# Patient Record
Sex: Male | Born: 1965 | Race: White | Hispanic: No | Marital: Single | Smoking: Current every day smoker
Health system: Southern US, Community
[De-identification: ages and names within clinical notes are randomized; demographics above are authoritative.]

## PROBLEM LIST (undated history)

## (undated) DIAGNOSIS — K635 Polyp of colon: Secondary | ICD-10-CM

## (undated) DIAGNOSIS — K2100 Gastro-esophageal reflux disease with esophagitis, without bleeding: Secondary | ICD-10-CM

## (undated) DIAGNOSIS — N529 Male erectile dysfunction, unspecified: Secondary | ICD-10-CM

## (undated) DIAGNOSIS — K648 Other hemorrhoids: Secondary | ICD-10-CM

## (undated) DIAGNOSIS — K5790 Diverticulosis of intestine, part unspecified, without perforation or abscess without bleeding: Secondary | ICD-10-CM

## (undated) HISTORY — PX: EXPLORATORY LAPAROTOMY: SUR591

## (undated) HISTORY — DX: Other hemorrhoids: K64.8

## (undated) HISTORY — PX: WRIST FUSION: SHX839

## (undated) HISTORY — PX: OTHER SURGICAL HISTORY: SHX169

## (undated) HISTORY — DX: Male erectile dysfunction, unspecified: N52.9

## (undated) HISTORY — DX: Diverticulosis of intestine, part unspecified, without perforation or abscess without bleeding: K57.90

## (undated) HISTORY — PX: KNEE SURGERY: SHX244

## (undated) HISTORY — DX: Gastro-esophageal reflux disease with esophagitis: K21.0

## (undated) HISTORY — DX: Gastro-esophageal reflux disease with esophagitis, without bleeding: K21.00

## (undated) HISTORY — PX: COLONOSCOPY W/ BIOPSIES: SHX1374

## (undated) HISTORY — DX: Polyp of colon: K63.5

---

## 1999-10-31 ENCOUNTER — Emergency Department (HOSPITAL_COMMUNITY): Admission: EM | Admit: 1999-10-31 | Discharge: 1999-10-31 | Payer: Self-pay | Admitting: Emergency Medicine

## 1999-11-01 ENCOUNTER — Encounter: Admission: RE | Admit: 1999-11-01 | Discharge: 1999-11-01 | Payer: Self-pay | Admitting: Internal Medicine

## 1999-11-08 ENCOUNTER — Encounter: Admission: RE | Admit: 1999-11-08 | Discharge: 1999-11-08 | Payer: Self-pay | Admitting: Internal Medicine

## 2005-05-05 ENCOUNTER — Emergency Department (HOSPITAL_COMMUNITY): Admission: EM | Admit: 2005-05-05 | Discharge: 2005-05-05 | Payer: Self-pay | Admitting: Emergency Medicine

## 2010-03-17 ENCOUNTER — Emergency Department (HOSPITAL_COMMUNITY)
Admission: EM | Admit: 2010-03-17 | Discharge: 2010-03-17 | Payer: Self-pay | Source: Home / Self Care | Admitting: Emergency Medicine

## 2012-06-20 DIAGNOSIS — K635 Polyp of colon: Secondary | ICD-10-CM

## 2012-06-20 HISTORY — DX: Polyp of colon: K63.5

## 2013-04-23 LAB — LIPID PANEL
CHOLESTEROL: 148 (ref 0–200)
HDL: 29 — AB (ref 35–70)
LDL CALC: 76
TRIGLYCERIDES: 216 — AB (ref 40–160)

## 2013-04-23 LAB — HEPATIC FUNCTION PANEL
ALK PHOS: 64 (ref 25–125)
ALT: 19 (ref 10–40)
AST: 12 — AB (ref 14–40)
BILIRUBIN, TOTAL: 0.2

## 2013-04-23 LAB — BASIC METABOLIC PANEL
BUN: 15 (ref 4–21)
Creatinine: 0.9 (ref 0.6–1.3)
Glucose: 112
Potassium: 4.1 (ref 3.4–5.3)
SODIUM: 142 (ref 137–147)

## 2013-07-28 LAB — HEMOGLOBIN A1C: Hemoglobin A1C: 5.5

## 2013-07-28 LAB — VITAMIN B12: VITAMIN B 12: 482

## 2015-01-20 LAB — LIPID PANEL
Cholesterol: 166 (ref 0–200)
HDL: 56 (ref 35–70)
LDL CALC: 87
Triglycerides: 117 (ref 40–160)

## 2015-01-20 LAB — BASIC METABOLIC PANEL
BUN: 18 (ref 4–21)
Creatinine: 1.1 (ref 0.6–1.3)
GLUCOSE: 120
Potassium: 4.2 (ref 3.4–5.3)
SODIUM: 143 (ref 137–147)

## 2015-01-20 LAB — HEPATIC FUNCTION PANEL
ALK PHOS: 81 (ref 25–125)
ALT: 31 (ref 10–40)
AST: 18 (ref 14–40)
BILIRUBIN, TOTAL: 0.2

## 2015-01-20 LAB — CBC AND DIFFERENTIAL
HEMATOCRIT: 41 (ref 41–53)
Hemoglobin: 13.5 (ref 13.5–17.5)
PLATELETS: 224 (ref 150–399)
WBC: 6.5

## 2015-01-20 LAB — VITAMIN D 25 HYDROXY (VIT D DEFICIENCY, FRACTURES): VIT D 25 HYDROXY: 41.1

## 2015-03-22 DIAGNOSIS — M7711 Lateral epicondylitis, right elbow: Secondary | ICD-10-CM | POA: Diagnosis not present

## 2015-05-19 DIAGNOSIS — M7711 Lateral epicondylitis, right elbow: Secondary | ICD-10-CM | POA: Diagnosis not present

## 2015-07-20 DIAGNOSIS — M7711 Lateral epicondylitis, right elbow: Secondary | ICD-10-CM | POA: Diagnosis not present

## 2015-10-20 DIAGNOSIS — M7711 Lateral epicondylitis, right elbow: Secondary | ICD-10-CM | POA: Diagnosis not present

## 2015-11-13 DIAGNOSIS — Z23 Encounter for immunization: Secondary | ICD-10-CM | POA: Diagnosis not present

## 2016-01-24 DIAGNOSIS — M7711 Lateral epicondylitis, right elbow: Secondary | ICD-10-CM | POA: Diagnosis not present

## 2016-04-19 DIAGNOSIS — K2210 Ulcer of esophagus without bleeding: Secondary | ICD-10-CM | POA: Diagnosis not present

## 2016-04-19 DIAGNOSIS — Z1389 Encounter for screening for other disorder: Secondary | ICD-10-CM | POA: Diagnosis not present

## 2016-04-19 DIAGNOSIS — R102 Pelvic and perineal pain: Secondary | ICD-10-CM | POA: Diagnosis not present

## 2016-04-19 DIAGNOSIS — E785 Hyperlipidemia, unspecified: Secondary | ICD-10-CM | POA: Diagnosis not present

## 2016-04-19 DIAGNOSIS — Z79899 Other long term (current) drug therapy: Secondary | ICD-10-CM | POA: Diagnosis not present

## 2016-04-19 DIAGNOSIS — Z0000 Encounter for general adult medical examination without abnormal findings: Secondary | ICD-10-CM | POA: Diagnosis not present

## 2016-04-19 DIAGNOSIS — Z125 Encounter for screening for malignant neoplasm of prostate: Secondary | ICD-10-CM | POA: Diagnosis not present

## 2016-04-19 LAB — LIPID PANEL
CHOLESTEROL: 162 (ref 0–200)
HDL: 55 (ref 35–70)
LDL Cholesterol: 76
Triglycerides: 157 (ref 40–160)

## 2016-04-19 LAB — HEPATIC FUNCTION PANEL
ALK PHOS: 95 (ref 25–125)
ALT: 23 (ref 10–40)
AST: 15 (ref 14–40)
BILIRUBIN, TOTAL: 0.1

## 2016-04-19 LAB — CBC AND DIFFERENTIAL
HCT: 41 (ref 41–53)
Hemoglobin: 13.7 (ref 13.5–17.5)
PLATELETS: 256 (ref 150–399)
WBC: 7.7

## 2016-04-19 LAB — BASIC METABOLIC PANEL
BUN: 25 — AB (ref 4–21)
CREATININE: 1 (ref 0.6–1.3)
Glucose: 100
POTASSIUM: 4 (ref 3.4–5.3)
Sodium: 141 (ref 137–147)

## 2016-04-19 LAB — PSA: PSA: 0.56

## 2016-04-24 DIAGNOSIS — M1711 Unilateral primary osteoarthritis, right knee: Secondary | ICD-10-CM | POA: Diagnosis not present

## 2016-04-24 DIAGNOSIS — M7711 Lateral epicondylitis, right elbow: Secondary | ICD-10-CM | POA: Diagnosis not present

## 2016-07-24 DIAGNOSIS — M25521 Pain in right elbow: Secondary | ICD-10-CM | POA: Diagnosis not present

## 2016-07-24 DIAGNOSIS — M7711 Lateral epicondylitis, right elbow: Secondary | ICD-10-CM | POA: Diagnosis not present

## 2016-08-20 DIAGNOSIS — Q799 Congenital malformation of musculoskeletal system, unspecified: Secondary | ICD-10-CM | POA: Diagnosis not present

## 2016-08-20 DIAGNOSIS — M24574 Contracture, right foot: Secondary | ICD-10-CM | POA: Diagnosis not present

## 2016-08-20 DIAGNOSIS — M24575 Contracture, left foot: Secondary | ICD-10-CM | POA: Diagnosis not present

## 2016-08-20 DIAGNOSIS — M19071 Primary osteoarthritis, right ankle and foot: Secondary | ICD-10-CM | POA: Diagnosis not present

## 2016-08-20 DIAGNOSIS — M779 Enthesopathy, unspecified: Secondary | ICD-10-CM | POA: Diagnosis not present

## 2016-08-20 DIAGNOSIS — M19072 Primary osteoarthritis, left ankle and foot: Secondary | ICD-10-CM | POA: Diagnosis not present

## 2016-10-05 DIAGNOSIS — T63441A Toxic effect of venom of bees, accidental (unintentional), initial encounter: Secondary | ICD-10-CM | POA: Diagnosis not present

## 2016-11-13 DIAGNOSIS — M7711 Lateral epicondylitis, right elbow: Secondary | ICD-10-CM | POA: Diagnosis not present

## 2016-11-13 DIAGNOSIS — M25521 Pain in right elbow: Secondary | ICD-10-CM | POA: Diagnosis not present

## 2016-12-23 DIAGNOSIS — Z23 Encounter for immunization: Secondary | ICD-10-CM | POA: Diagnosis not present

## 2017-01-30 ENCOUNTER — Ambulatory Visit (INDEPENDENT_AMBULATORY_CARE_PROVIDER_SITE_OTHER): Payer: Medicare Other | Admitting: Family Medicine

## 2017-01-30 ENCOUNTER — Encounter: Payer: Self-pay | Admitting: Family Medicine

## 2017-01-30 VITALS — BP 118/78 | HR 98 | Temp 98.6°F | Ht 72.0 in | Wt 225.0 lb

## 2017-01-30 DIAGNOSIS — Z833 Family history of diabetes mellitus: Secondary | ICD-10-CM | POA: Diagnosis not present

## 2017-01-30 DIAGNOSIS — J441 Chronic obstructive pulmonary disease with (acute) exacerbation: Secondary | ICD-10-CM | POA: Diagnosis not present

## 2017-01-30 DIAGNOSIS — N529 Male erectile dysfunction, unspecified: Secondary | ICD-10-CM | POA: Diagnosis not present

## 2017-01-30 DIAGNOSIS — K219 Gastro-esophageal reflux disease without esophagitis: Secondary | ICD-10-CM

## 2017-01-30 DIAGNOSIS — Z736 Limitation of activities due to disability: Secondary | ICD-10-CM | POA: Diagnosis not present

## 2017-01-30 DIAGNOSIS — Z789 Other specified health status: Secondary | ICD-10-CM | POA: Diagnosis not present

## 2017-01-30 DIAGNOSIS — F17200 Nicotine dependence, unspecified, uncomplicated: Secondary | ICD-10-CM

## 2017-01-30 DIAGNOSIS — Z723 Lack of physical exercise: Secondary | ICD-10-CM | POA: Diagnosis not present

## 2017-01-30 MED ORDER — FLUTICASONE PROPIONATE 50 MCG/ACT NA SUSP
NASAL | 6 refills | Status: DC
Start: 2017-01-30 — End: 2017-01-30

## 2017-01-30 MED ORDER — FLUTICASONE PROPIONATE 50 MCG/ACT NA SUSP: NASAL | 6 refills | Status: DC

## 2017-01-30 MED ORDER — HYDROCOD POLST-CPM POLST ER 10-8 MG/5ML PO SUER: 5.0000 mL | Freq: Two times a day (BID) | ORAL | 0 refills | Status: DC | PRN

## 2017-01-30 MED ORDER — FLUTICASONE-SALMETEROL 250-50 MCG/DOSE IN AEPB: 1.0000 | INHALATION_SPRAY | Freq: Two times a day (BID) | RESPIRATORY_TRACT | 3 refills | Status: DC

## 2017-01-30 NOTE — Progress Notes (Signed)
New patient office visit note:  Impression and Recommendations:    1. COPD with acute exacerbation ([place])   2. Gastroesophageal reflux disease, esophagitis presence not specified   3. Current every day smoker-about 20-25-pack-year history.   4. Social drinker   5. Family history of diabetes mellitus in father- age 51   6. Erectile dysfunction, unspecified erectile dysfunction type   7. on disability but is very active   8. Inactivity    Told pt:  Please make sure you sign papers so we can get the paperwork from your old doctor and your labs etc.   1. Gastroesophageal reflux disease, esophagitis presence not specified -Continue omeprazole as needed  2. Current every day smoker-about 20-25-pack-year history. -Asiyah made to give patient smoking cessation materials.   - Patient currently in pre-contemplative phases of change  3. Social drinker -Not an issue for himself or other family members does not think it is an issue for patient  4. Family history of diabetes mellitus in father- age 1 -  Since you had a physical in January with blood work you can make an appointment with me for a physical this coming January and at that time we can get the fasting blood work; we will screen for diabetes  5. Erectile dysfunction, unspecified erectile dysfunction type -Explained to patient it is important that he quit smoking.  Discussed damage to vasculature which can cause ED in older men  6. COPD with acute exacerbation ([facility]) - Fluticasone-Salmeterol (ADVAIR DISKUS) 250-50 MCG/DOSE AEPB; Inhale 1 puff 2 (two) times daily into the lungs. - fluticasone (FLONASE) 50 MCG/ACT nasal spray; 1 spray each nostril twice daily after sinus rinse using a YR or Milta Deiters med or the like - If you are not feeling better over the next couple of weeks or if you take a turn for the worse and developed fever /chills and increase in your symptoms call me as we might need to start Augmentin or a Z-Pak on you  7.  on disability but is very active -Patient does heavy yard work, Insurance claims handler.   - Does not appear to have physical limitations  8. Inactivity -Encouraged 150 minutes weekly of cardiovascular moderate intensity activity.  Other orders - omeprazole (PRILOSEC) 20 MG capsule; Take 20 mg daily by mouth. - sildenafil (VIAGRA) 50 MG tablet; Take 50 mg daily as needed by mouth for erectile dysfunction. - chlorpheniramine-HYDROcodone ([place]) 10-8 MG/5ML SUER; Take 5 mLs every 12 (twelve) hours as needed by mouth for cough (cough, will cause drowsiness.).  Meds ordered this encounter  . chlorpheniramine-HYDROcodone (TUSSIONEX) 10-8 MG/5ML SUER    Sig: Take 5 mLs every 12 (twelve) hours as needed by mouth for cough (cough, will cause drowsiness.).    Dispense:  120 mL    Refill:  0  . Fluticasone-Salmeterol (ADVAIR DISKUS) 250-50 MCG/DOSE AEPB    Sig: Inhale 1 puff 2 (two) times daily into the lungs.    Dispense:  1 each    Refill:  3  . fluticasone (FLONASE) 50 MCG/ACT nasal spray    Sig: 1 spray each nostril twice daily after sinus rinse using a YR or Milta Deiters med or the like    Dispense:  16 g    Refill:  6     Education and routine counseling performed. Handouts provided.   Gross side effects, risk and benefits, and alternatives of medications discussed with patient.  Patient is aware that all medications have potential side effects and we  are unable to predict every side effect or drug-drug interaction that may occur.  Expresses verbal understanding and consents to current therapy plan and treatment regimen.  Return for couple months- For fasting labs and CPE.  Please see AVS handed out to patient at the end of our visit for further patient instructions/ counseling done pertaining to today's office visit.    Note: This document was prepared using Dragon voice recognition software and may include unintentional dictation  errors.  ----------------------------------------------------------------------------------------------------------------------    Subjective:    Chief complaint:   Chief Complaint  Patient presents with  . Establish Care     HPI: Charles Martin is a pleasant 51 y.o. male who presents to [place] [facility] today to review their medical history with me and establish care.   I asked the patient to review their chronic problem list with me to ensure everything was updated and accurate.    All recent office visits with other providers, any medical records that patient brought in etc  - I reviewed today.     Also asked pt to get me medical records from [facility] providers/ specialists that they had seen within the past 3-5 years- if they are in private practice and/or do not work for a [employer], [facility], [facility], [place] or [employer] owned practice.  Told them to call their specialists to clarify this if they are not sure.    Did go to the doctor yrly prior.  Used to weigh 275lbs couple yrs ago.   Moved from [place] recently-  BF of [name], [name].   Disabled from bad accident- broke bunch of bones back in 1989- fell through roof. 12 sx.   Chronic R elbow pain   Patient is currently dating.   One partner- [name]- together since June 2018.    He has 2 children and 7 grandchildren.     Exercises-  Very little.  But works manual labor often- active lifestyle but no set exercise routine.   20-25 yrs- smoked cig.  About 1ppd, no interest in quitting.   Sick for 3-4 wks. has had a productive cough then.  Worse at night.  Coughs up greenish yellow stuff. No F/C objectively. Wheezing - w at night.     Wt Readings from Last 3 Encounters:  01/30/17 225 lb (102.1 kg)   BP Readings from Last 3 Encounters:  01/30/17 118/78   Pulse Readings from Last 3 Encounters:  01/30/17 98   BMI Readings from Last 3 Encounters:  01/30/17 30.52 kg/m    Patient Care Team     Relationship Specialty Notifications Start End  [name], DO PCP - General Family Medicine  01/30/17     Patient Active Problem List   Diagnosis Date Noted  . Current every day smoker-about 20-25-pack-year history. 01/30/2017    Priority: High  . GERD (gastroesophageal reflux disease) 01/30/2017    Priority: Medium  . Family history of diabetes mellitus in father- age 71 01/30/2017    Priority: Low  . ED (erectile dysfunction) 01/30/2017    Priority: Low  . on disability but is very active 02/12/2017  . Inactivity 02/12/2017  . Social drinker 01/30/2017     Past Medical History:  Diagnosis Date  . Erectile dysfunction   . Reflux esophagitis      Past Medical History:  Diagnosis Date  . Erectile dysfunction   . Reflux esophagitis      Past Surgical History:  Procedure Laterality Date  .  KNEE SURGERY Left   . WRIST FUSION Left      Family History  Problem Relation Age of Onset  . Diabetes Father   . Congestive Heart Failure Paternal Grandmother      Social History   Substance and Sexual Activity  Drug Use No     Social History   Substance and Sexual Activity  Alcohol Use Yes   Comment: occ.      Social History   Tobacco Use  Smoking Status Current Every Day Smoker  . Packs/day: 0.50  . Types: Cigarettes  Smokeless Tobacco Never Used  Tobacco Comment   patient uses vapor     Outpatient Encounter Medications as of 01/30/2017  Medication Sig  . omeprazole (PRILOSEC) 20 MG capsule Take 20 mg daily by mouth.  . sildenafil (VIAGRA) 50 MG tablet Take 50 mg daily as needed by mouth for erectile dysfunction.  . chlorpheniramine-HYDROcodone (TUSSIONEX) 10-8 MG/5ML SUER Take 5 mLs every 12 (twelve) hours as needed by mouth for cough (cough, will cause drowsiness.).  [name] fluticasone (FLONASE) 50 MCG/ACT nasal spray 1 spray each nostril twice daily after sinus rinse using a YR or Milta Deiters med or the like  . Fluticasone-Salmeterol (ADVAIR DISKUS)  250-50 MCG/DOSE AEPB Inhale 1 puff 2 (two) times daily into the lungs.  . [DISCONTINUED] chlorpheniramine-HYDROcodone (TUSSIONEX) 10-8 MG/5ML SUER Take 5 mLs every 12 (twelve) hours as needed by mouth for cough (cough, will cause drowsiness.).  . [DISCONTINUED] fluticasone (FLONASE) 50 MCG/ACT nasal spray 1 spray each nostril twice daily after sinus rinse using a YR or Milta Deiters med or the like  . [DISCONTINUED] fluticasone (FLONASE) 50 MCG/ACT nasal spray 1 spray each nostril twice daily after sinus rinse using a YR or Milta Deiters med or the like  . [DISCONTINUED] Fluticasone-Salmeterol (ADVAIR DISKUS) 250-50 MCG/DOSE AEPB Inhale 1 puff 2 (two) times daily into the lungs.  . [DISCONTINUED] Fluticasone-Salmeterol (ADVAIR DISKUS) 250-50 MCG/DOSE AEPB Inhale 1 puff 2 (two) times daily into the lungs.   No facility-administered encounter medications on file as of 01/30/2017.     Allergies: Patient has no allergy information on record.   ROS   Objective:   Blood pressure 118/78, pulse 98, temperature 98.6 F (37 C), temperature source Oral, height 6' (1.829 m), weight 225 lb (102.1 kg). Body mass index is 30.52 kg/m. General: Well Developed, well nourished, and in no acute distress.  Neuro: Alert and oriented x3, extra-ocular muscles intact, sensation grossly intact.  HEENT:[place]/AT, PERRLA, neck supple, No carotid bruits Skin: no gross rashes  Cardiac: Regular rate and rhythm Respiratory: Essentially clear to auscultation bilaterally. Not using accessory muscles, speaking in full sentences.  Abdominal: not grossly distended Musculoskeletal: Ambulates w/o diff, FROM * 4 ext.  Vasc: less 2 sec cap RF, warm and pink  Psych:  No HI/SI, judgement and insight good, Euthymic mood. Full Affect.    No results found for this or any previous visit (from the past 2160 hour(s)).

## 2017-01-30 NOTE — Patient Instructions (Addendum)
If you are not feeling better over the next couple of weeks or if you take a turn for the worse and developed fever chills and increase in your symptoms call me as we might need to start Augmentin or a Z-Pak on you.  Since you had a physical in January with blood work you can make an appointment with me for a physical this coming January and at that time we can get the fasting blood work.  Please make sure you sign papers so we can get the paperwork from your old doctor and your labs etc.      Please realize, EXERCISE IS MEDICINE!  -  American Heart Association [facility]) guidelines for exercise : If you are in good health, without any medical conditions, you should engage in 150 minutes of moderate intensity aerobic activity per week.  This means you should be huffing and puffing throughout your workout.   Engaging in regular exercise will improve brain function and memory, as well as improve mood, boost immune system and help with weight management.  As well as the other, more well-known effects of exercise such as decreasing blood sugar levels, decreasing blood pressure,  and decreasing bad cholesterol levels/ increasing good cholesterol levels.     -  The AHA strongly endorses consumption of a diet that contains a variety of foods from all the food categories with an emphasis on fruits and vegetables; fat-free and low-fat dairy products; cereal and grain products; legumes and nuts; and fish, poultry, and/or extra lean meats.    Excessive food intake, especially of foods high in saturated and trans fats, sugar, and salt, should be avoided.    Adequate water intake of roughly 1/2 of your weight in pounds, should equal the ounces of water per day you should drink.  So for instance, if you're 200 pounds, that would be 100 ounces of water per day.         Mediterranean Diet  Why follow it? Research shows. . Those who follow the Mediterranean diet have a reduced risk of heart disease  . The diet is  associated with a reduced incidence of Parkinson's and Alzheimer's diseases . People following the diet may have longer life expectancies and lower rates of chronic diseases  . The Dietary Guidelines for Americans recommends the Mediterranean diet as an eating plan to promote health and prevent disease  What Is the Mediterranean Diet?  . Healthy eating plan based on typical foods and recipes of Mediterranean-style cooking . The diet is primarily a plant based diet; these foods should make up a majority of meals   Starches - Plant based foods should make up a majority of meals - They are an important sources of vitamins, minerals, energy, antioxidants, and fiber - Choose whole grains, foods high in fiber and minimally processed items  - Typical grain sources include wheat, oats, barley, corn, brown rice, bulgar, farro, millet, polenta, couscous  - Various types of beans include chickpeas, lentils, fava beans, black beans, white beans   Fruits  Veggies - Large quantities of antioxidant rich fruits & veggies; 6 or more servings  - Vegetables can be eaten raw or lightly drizzled with oil and cooked  - Vegetables common to the traditional Mediterranean Diet include: artichokes, arugula, beets, broccoli, brussel sprouts, cabbage, carrots, celery, collard greens, cucumbers, eggplant, kale, leeks, lemons, lettuce, mushrooms, okra, onions, peas, peppers, potatoes, pumpkin, radishes, rutabaga, shallots, spinach, sweet potatoes, turnips, zucchini - Fruits common to the Mediterranean Diet include:  apples, apricots, avocados, cherries, clementines, dates, figs, grapefruits, grapes, melons, nectarines, oranges, peaches, pears, pomegranates, strawberries, tangerines  Fats - Replace butter and margarine with healthy oils, such as olive oil, canola oil, and tahini  - Limit nuts to no more than a handful a day  - Nuts include walnuts, almonds, pecans, pistachios, pine nuts  - Limit or avoid candied, honey roasted  or heavily salted nuts - Olives are central to the Mediterranean diet - can be eaten whole or used in a variety of dishes   Meats Protein - Limiting red meat: no more than a few times a month - When eating red meat: choose lean cuts and keep the portion to the size of deck of cards - Eggs: approx. 0 to 4 times a week  - Fish and lean poultry: at least 2 a week  - Healthy protein sources include, chicken, [place], lean beef, lamb - Increase intake of seafood such as tuna, salmon, trout, mackerel, shrimp, scallops - Avoid or limit high fat processed meats such as sausage and bacon  Dairy - Include moderate amounts of low fat dairy products  - Focus on healthy dairy such as fat free yogurt, skim milk, low or reduced fat cheese - Limit dairy products higher in fat such as whole or 2% milk, cheese, ice cream  Alcohol - Moderate amounts of red wine is ok  - No more than 5 oz daily for women (all ages) and men older than age 38  - No more than 10 oz of wine daily for men younger than 52  Other - Limit sweets and other desserts  - Use herbs and spices instead of salt to flavor foods  - Herbs and spices common to the traditional Mediterranean Diet include: basil, bay leaves, chives, cloves, cumin, fennel, garlic, lavender, marjoram, mint, oregano, parsley, pepper, rosemary, sage, savory, sumac, tarragon, thyme   It's not just a diet, it's a lifestyle:  . The Mediterranean diet includes lifestyle factors typical of those in the region  . Foods, drinks and meals are best eaten with others and savored . Daily physical activity is important for overall good health . This could be strenuous exercise like running and aerobics . This could also be more leisurely activities such as walking, housework, yard-work, or taking the stairs . Moderation is the key; a balanced and healthy diet accommodates most foods and drinks . Consider portion sizes and frequency of consumption of certain foods   Meal Ideas &  Options:  . Breakfast:  o Whole wheat toast or whole wheat English muffins with peanut butter & hard boiled egg o Steel cut oats topped with apples & cinnamon and skim milk  o Fresh fruit: banana, strawberries, melon, berries, peaches  o Smoothies: strawberries, bananas, greek yogurt, peanut butter o Low fat greek yogurt with blueberries and granola  o Egg white omelet with spinach and mushrooms o Breakfast couscous: whole wheat couscous, apricots, skim milk, cranberries  . Sandwiches:  o Hummus and grilled vegetables (peppers, zucchini, squash) on whole wheat bread   o Grilled chicken on whole wheat pita with lettuce, tomatoes, cucumbers or tzatziki  o Tuna salad on whole wheat bread: tuna salad made with greek yogurt, olives, red peppers, capers, green onions o Garlic rosemary lamb pita: lamb sauted with garlic, rosemary, salt & pepper; add lettuce, cucumber, greek yogurt to pita - flavor with lemon juice and black pepper  . Seafood:  o Mediterranean grilled salmon, seasoned with garlic, basil, parsley, lemon  juice and black pepper o Shrimp, lemon, and spinach whole-grain pasta salad made with low fat greek yogurt  o Seared scallops with lemon orzo  o Seared tuna steaks seasoned salt, pepper, coriander topped with tomato mixture of olives, tomatoes, olive oil, minced garlic, parsley, green onions and cappers  . Meats:  o Herbed greek chicken salad with kalamata olives, cucumber, feta  o Red bell peppers stuffed with spinach, bulgur, lean ground beef (or lentils) & topped with feta   o Kebabs: skewers of chicken, tomatoes, onions, zucchini, squash  o [place] burgers: made with red onions, mint, dill, lemon juice, feta cheese topped with roasted red peppers . Vegetarian o Cucumber salad: cucumbers, artichoke hearts, celery, red onion, feta cheese, tossed in olive oil & lemon juice  o Hummus and whole grain pita points with a greek salad (lettuce, tomato, feta, olives, cucumbers, red  onion) o Lentil soup with celery, carrots made with vegetable broth, garlic, salt and pepper  o Tabouli salad: parsley, bulgur, mint, scallions, cucumbers, tomato, radishes, lemon juice, olive oil, salt and pepper.      Please think seriously about quitting smoking!  This is very important for your health and well being.   Smoking cessation instruction/counseling given:  counseled patient on the dangers of tobacco use, advised patient to stop smoking, and reviewed strategies to maximize success  Discussed with patient that there are multiple treatments to aid in quitting smoking, however I explained none will work unless pt really wants to quit  Please let us know in the future if you are interested and ready to quit.  You can also call 1-800-QUIT-NOW (763) 879-3888) for free smoking cessation counseling and support.     Also, please go online to www.heart.org (the American Heart Association website) and search "quit smoking ".     Or try the centers for disease control website at: https://www.schmidt.com/  Or, go to the "national cancer institute" web site of NIH:  http://benson.com/  There is a lot of great information on these websites for you to look over.      Want to Quit Smoking? FDA-Approved Products Can Help  Quitting smoking can be hard, but it is possible. In fact, every time you put out a cigarette is a new chance to try quitting again, according to the U.[place]. Food and Drug Administration's newest tobacco education campaign, "Every Try Counts."   If you want to quit-almost 70 percent of adult smokers say they do-you may want to use a "smoking cessation" product proven to help. Data has shown that using FDA-approved cessation medicine can double your chance of quitting successfully.  Some products contain nicotine as an active ingredient and others do not. These products include  over-the-counter (OTC) options like skin patches, lozenges, and gum, as well as prescription medicines.  Smoking cessation products are intended to help you quit smoking. They are regulated through the [facility] Center for Drug Evaluation and Research, which ensures that the products are safe and effective and that their benefits outweigh any known associated risks.  The Benefits of Quitting Smoking No matter how much you smoke-or for how long-quitting will benefit you.  Not only will you lower your risk of getting various cancers, including lung cancer, you'll also reduce your chances of having heart disease, a stroke, emphysema, and other serious diseases. Quitting also will lower the risk of heart disease and lung cancer in nonsmokers who otherwise would be exposed to your secondhand smoke.  Although there are benefits to quitting at  any age, it is important to quit as soon as possible so your body can begin to heal from the damage caused by smoking. For instance, 12 hours after you quit smoking the carbon monoxide level in your blood drops to normal. Carbon monoxide is harmful because it displaces oxygen in the blood and deprives your heart, brain, and other vital organs of oxygen.  What To Know About Smoking Cessation Products Understanding how smoking cessation products work-and what side effects they may cause-can help you determine which product may be best for you.  If you're considering one of these products, reading labels and talking to your pharmacist and other health care providers are good first steps to take.  You also can check the FDA's website for more information on each product at Drugs@FDA , where you can search for each product by name.  And remember to weigh each product's benefits and risks, among other considerations.  About Nicotine Replacement Therapy (NRT) Nicotine is the substance primarily responsible for causing addiction to tobacco products. Tobacco users who are  addicted to nicotine are used to having nicotine in their bodies.  As you try to quit smoking, you may have symptoms of nicotine withdrawal. When you quit, this withdrawal may cause symptoms like cravings, or urges, to smoke; depression; trouble sleeping; irritability; anxiety; and increased appetite.  Nicotine withdrawal can discourage some smokers from continuing with a quit attempt. But the FDA has approved several smoking cessation products designed to help users gradually withdraw from smoking (that is, "wean" themselves from smoking) by using specific amounts of nicotine that decrease over time. This type of product is called a "nicotine replacement therapy" or NRT. It supplies nicotine in controlled amounts while sparing you from other chemicals found in tobacco products.  NRTs are available over the counter and by prescription. You should generally use them only for a short time to help you manage nicotine cravings and withdrawal. However, the FDA recognizes that some people may need to use these products longer to stay smoke-free. Talk to your health care provider to determine the best course of treatment for you.  Over-the-counter NRTs are approved for sale to people age 54 and older. They are available under various brand names and sometimes as generic products. They include:  - Skin patches (also called "transdermal nicotine patches"). These patches are placed on the skin, similar to how you would apply an adhesive bandage. - Chewing gum (also called "nicotine gum"). This gum must be chewed according to the labeled instructions to be effective. - Lozenges (also called "nicotine lozenges"). You use these products by dissolving them in your mouth. For over-the-counter products, it's important to follow the instructions on the Drug Facts Label (DFL) and to read the enclosed User's Guide for complete directions and other important information. Ask your health care provider if you have  questions.  Currently, prescription nicotine replacement therapy is available only under the brand name Nicotrol, and is available both as a nasal spray and an oral inhaler. The products are FDA-approved only for use by adults.  If you are under age 9 and want to quit smoking, talk to a health care professional about whether you should use nicotine replacement therapies.  Important Advice for People Considering Nicotine Replacement Therapy Women who are pregnant or breastfeeding should talk to their health care providers and use nicotine replacement products only if the health care providers approve.  Also talk to your health care provider before using these products if you have:  diabetes, heart disease, asthma, or stomach ulcers; had a recent heart attack; high blood pressure that is not controlled with medicine; a history of irregular heartbeat; or been prescribed medication to help you quit smoking. If you take prescription medication for depression or asthma, tell your health care provider if you are quitting smoking because he or she may need to change your prescription dose.  Stop using a nicotine replacement product and call your health care professional if you have any of the following symptoms: nausea; dizziness; weakness; vomiting; fast or irregular heartbeat; mouth problems with the lozenge or gum; or redness or swelling of the skin around the patch that does not go away.  About Prescription Cessation Medicines Without Nicotine  The FDA has approved two smoking cessation products that do not contain nicotine. They are Chantix (varenicline tartrate) and Zyban (buproprion hydrochloride). Both are available in tablet form and by prescription only.  Chantix acts at sites in the brain affected by nicotine by reducing the rewarding effects of nicotine. The precise way that Zyban helps with smoking cessation is unknown.  As with other prescription products, the FDA has  evaluated these medicines and found that the benefits outweigh the risks. For users taking these products, risks include changes in behavior, depressed mood, hostility, aggression, and suicidal thoughts or actions.  The most common side effects of Chantix include nausea; constipation; gas; vomiting; and trouble sleeping or vivid, unusual, or strange dreams. Chantix also may change how you react to alcohol, so talk to your health care provider about your drinking habits (if you drink alcohol) and whether these habits need to change. Chantix is not recommended for people under the age of 37.  The most commonly observed side effects consistently associated with the use of Zyban are dry mouth and insomnia.  Because Zyban contains the same active ingredient as the antidepressant Wellbutrin (bupropion), the FDA encourages people who use Zyban-and those who are considering it-to talk to their health care providers about the risks of treatment with antidepressant medicines. Zyban has not been studied in children under the age of 25 and is not approved for use in children and teenagers.  Note: If your health care provider prescribes Chantix or Zyban, please read the product's patient medication guide in its entirety. These guides offer important information on side effects, risks, warnings, product ingredients, and what you should talk about with your health care provider before taking the products.  Finally, if you ever have any side effects related to any smoking cessation products, or have any other problems related to your treatment, the FDA would like to hear from you. Please consider making a voluntary and confidential report to the FDA's MedWatch program.  Updated: February 27, 2016

## 2017-01-31 ENCOUNTER — Telehealth: Payer: Self-pay | Admitting: Family Medicine

## 2017-01-31 MED ORDER — FLUTICASONE PROPIONATE 50 MCG/ACT NA SUSP: NASAL | 6 refills | Status: DC

## 2017-01-31 MED ORDER — FLUTICASONE-SALMETEROL 250-50 MCG/DOSE IN AEPB: 1.0000 | INHALATION_SPRAY | Freq: Two times a day (BID) | RESPIRATORY_TRACT | 3 refills | Status: DC

## 2017-01-31 NOTE — Telephone Encounter (Signed)
Resent both prescriptions and notified the patient. MPulliam, CMA/RT(R)

## 2017-01-31 NOTE — Telephone Encounter (Signed)
Pt called to request Rx (fluticasone Salmeterol /Advair Diskus 250-50 MCG/ dose AEPB) be sent to [facility] on [address] in [place]. --glh

## 2017-02-11 ENCOUNTER — Telehealth: Payer: Self-pay | Admitting: Family Medicine

## 2017-02-11 NOTE — Telephone Encounter (Signed)
Patient was seen 01/30/2017 - productive cough and wheezing patient was advised to call back if no improvement. Please advise. MPulliam, CMA/RT(R)

## 2017-02-11 NOTE — Telephone Encounter (Signed)
Patient was told to call back if he failed to improve, he was told that she would possibly call in a z pack and if approved please send to [facility] [address] in [place]

## 2017-02-12 ENCOUNTER — Encounter: Payer: Self-pay | Admitting: Family Medicine

## 2017-02-12 ENCOUNTER — Other Ambulatory Visit: Payer: Self-pay

## 2017-02-12 DIAGNOSIS — J440 Chronic obstructive pulmonary disease with acute lower respiratory infection: Principal | ICD-10-CM

## 2017-02-12 DIAGNOSIS — J209 Acute bronchitis, unspecified: Secondary | ICD-10-CM

## 2017-02-12 DIAGNOSIS — Z723 Lack of physical exercise: Secondary | ICD-10-CM | POA: Insufficient documentation

## 2017-02-12 DIAGNOSIS — Z736 Limitation of activities due to disability: Secondary | ICD-10-CM | POA: Insufficient documentation

## 2017-02-12 MED ORDER — AZITHROMYCIN 250 MG PO TABS: ORAL_TABLET | ORAL | 0 refills | Status: DC

## 2017-02-12 NOTE — Telephone Encounter (Signed)
Sent in [place] per Dr. Hershal Coria instructions, patient notified. MPulliam, CMA/RT(R)

## 2017-02-12 NOTE — Telephone Encounter (Signed)
Patient called and stated that he is having no improvement in upper respiratory symptoms.  Per Dr. Raliegh Scarlet sent in z-pak.  MPulliam, CMA/RT(R)

## 2017-02-13 DIAGNOSIS — L570 Actinic keratosis: Secondary | ICD-10-CM | POA: Diagnosis not present

## 2017-02-13 DIAGNOSIS — B009 Herpesviral infection, unspecified: Secondary | ICD-10-CM | POA: Diagnosis not present

## 2017-02-13 DIAGNOSIS — B353 Tinea pedis: Secondary | ICD-10-CM | POA: Diagnosis not present

## 2017-02-13 DIAGNOSIS — L814 Other melanin hyperpigmentation: Secondary | ICD-10-CM | POA: Diagnosis not present

## 2017-02-13 DIAGNOSIS — D239 Other benign neoplasm of skin, unspecified: Secondary | ICD-10-CM | POA: Diagnosis not present

## 2017-02-13 DIAGNOSIS — L821 Other seborrheic keratosis: Secondary | ICD-10-CM | POA: Diagnosis not present

## 2017-03-01 ENCOUNTER — Other Ambulatory Visit: Payer: Self-pay

## 2017-03-01 NOTE — Telephone Encounter (Signed)
Pharmacy sent over refill request for Omeprazole. Medication was last fill by a previous provider, request was sent to Dr. Raliegh Scarlet to review. MPulliam, CMA/RT(R)

## 2017-03-04 MED ORDER — OMEPRAZOLE 20 MG PO CPDR: 20.0000 mg | DELAYED_RELEASE_CAPSULE | Freq: Every day | ORAL | 1 refills | Status: DC

## 2017-03-05 DIAGNOSIS — M7711 Lateral epicondylitis, right elbow: Secondary | ICD-10-CM | POA: Diagnosis not present

## 2017-04-15 ENCOUNTER — Telehealth: Payer: Self-pay | Admitting: Internal Medicine

## 2017-04-15 ENCOUNTER — Ambulatory Visit (INDEPENDENT_AMBULATORY_CARE_PROVIDER_SITE_OTHER): Payer: Medicare Other | Admitting: Family Medicine

## 2017-04-15 ENCOUNTER — Encounter: Payer: Self-pay | Admitting: Family Medicine

## 2017-04-15 VITALS — BP 134/89 | HR 82 | Ht 72.0 in | Wt 220.6 lb

## 2017-04-15 DIAGNOSIS — Z719 Counseling, unspecified: Secondary | ICD-10-CM

## 2017-04-15 DIAGNOSIS — N529 Male erectile dysfunction, unspecified: Secondary | ICD-10-CM

## 2017-04-15 DIAGNOSIS — Z1389 Encounter for screening for other disorder: Secondary | ICD-10-CM

## 2017-04-15 DIAGNOSIS — Z0000 Encounter for general adult medical examination without abnormal findings: Secondary | ICD-10-CM | POA: Diagnosis not present

## 2017-04-15 DIAGNOSIS — R5383 Other fatigue: Secondary | ICD-10-CM | POA: Diagnosis not present

## 2017-04-15 DIAGNOSIS — Z833 Family history of diabetes mellitus: Secondary | ICD-10-CM | POA: Diagnosis not present

## 2017-04-15 DIAGNOSIS — F17200 Nicotine dependence, unspecified, uncomplicated: Secondary | ICD-10-CM | POA: Diagnosis not present

## 2017-04-15 DIAGNOSIS — E559 Vitamin D deficiency, unspecified: Secondary | ICD-10-CM | POA: Diagnosis not present

## 2017-04-15 MED ORDER — SILDENAFIL CITRATE 20 MG PO TABS: 20.0000 mg | ORAL_TABLET | ORAL | 1 refills | Status: DC | PRN

## 2017-04-15 MED ORDER — ZOSTER VAC RECOMB ADJUVANTED 50 MCG/0.5ML IM SUSR: 0.5000 mL | Freq: Once | INTRAMUSCULAR | 0 refills | Status: AC

## 2017-04-15 NOTE — Telephone Encounter (Signed)
Printed colon/path report from [facility]. Patient states that he is due for another colonoscopy. Dr. Carlean Purl is Doc of the Day for 04/15/17 am. Records placed on his desk for review.

## 2017-04-15 NOTE — Patient Instructions (Signed)

## 2017-04-15 NOTE — Progress Notes (Signed)
Male physical  Impression and Recommendations:    1. Encounter for wellness examination   2. Health education/counseling   3. Screening for multiple conditions   4. Current every day smoker-about 20-25-pack-year history.   5. Erectile dysfunction, unspecified erectile dysfunction type      1) Anticipatory Guidance: Discussed importance of wearing a seatbelt while driving, not texting while driving;   sunscreen when outside along with skin surveillance; eating a balanced and modest diet; physical activity at least 25 minutes per day or 150 min/ week moderate to intense activity.  2) Immunizations / Screenings / Labs:  All immunizations are up-to-date per recommendations or will be updated today. Patient is due for dental and vision screens which pt will schedule independently. Will obtain CBC, CMP, HgA1c, Lipid panel, TSH and vit D when fasting, if not already done recently.   3) Weight:  BMI meaning discussed with patient.  Discussed goal of losing 5-10% of current body weight which would improve overall feelings of well being and improve objective health data. Improve nutrient density of diet through increasing intake of fruits and vegetables and decreasing saturated fats, white flour products and refined sugars.   4) Current every day smoker -Encouraged to continue stopping smoking. Because of his h/o smoking, educated pt about future referral for CT scan for lung cancer screening at age 51.   32) Erectile Dysfunction- meds refilled as listed below. Continue stopping smoking as this can make your ED worse.  -as pt is due for colonoscopy this year, pt referred for colonoscopy. If you have not heard about this appointment in the near future, call Dorothea Ogle at the office and ask him to schedule this.   Orders Placed This Encounter  Procedures  . Comprehensive metabolic panel  . Hemoglobin A1c  . Lipid panel  . TSH  . VITAMIN D 25 Hydroxy (Vit-D Deficiency, Fractures)  . CBC with  Differential/Platelet  . HIV antibody  . Hepatitis C antibody  . T4, free  . Ambulatory referral to Gastroenterology    Referral Priority:   Routine    Referral Type:   Consultation    Referral Reason:   Specialty Services Required    Number of Visits Requested:   1    Meds ordered this encounter  Medications  . Zoster Vaccine Adjuvanted [facility]) injection    Sig: Inject 0.5 mLs into the muscle once for 1 dose.    Dispense:  0.5 mL    Refill:  0  . sildenafil (REVATIO) 20 MG tablet    Sig: Take 1 tablet (20 mg total) by mouth as needed.    Dispense:  50 tablet    Refill:  1     Gross side effects, risk and benefits, and alternatives of medications discussed with patient.  Patient is aware that all medications have potential side effects and we are unable to predict every side effect or drug-drug interaction that may occur.  Expresses verbal understanding and consents to current therapy plan and treatment regimen.  Please see AVS handed out to patient at the end of our visit for further patient instructions/ counseling done pertaining to today's office visit.  Follow-up preventative CPE in 1 year. Follow-up office visit pending lab work.  F/up sooner for chronic care management and/or prn   This document serves as a record of services personally performed by [name], DO. It was created on her behalf by Mayer Masker, a trained medical scribe. The creation of this record is based  on the scribe's personal observations and the provider's statements to them.   I have reviewed the above medical documentation for accuracy and completeness and I concur.  [name] 04/15/17 9:01 AM   Subjective:    CC: CPE  HPI: Charles Martin is a 52 y.o. male who presents to [facility] today for a yearly health maintenance exam.  Pt states his girlfriend has referred him to all of her health care providers.  Health Maintenance Summary Reviewed and  updated, unless pt declines services.  Colonoscopy:   UTD- but he is due for another one this year. Pt has previous h/o polyps at age <50 and his last one was 28.  Tobacco History Reviewed:  Pt cut smoking to <0.5 packs/day, and is still smoking vapor.  CT scan for screening lung CA:   Not due until age 27. Abdominal Ultrasound:  NA. Not due until age 47.  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 STD concerns:   none Drug Use:   None Birth control method:   n/a Testicular/penile concerns:     NA  Eye Doctor: pt has not seen an eye doctor recently. He wears reading glasses but thinks they are not working as well for him anymore.  Dentist: 5-6 years ago was his last appointment but he states he has a new appointment coming up soon.  Sexual health: no concerns, his silidenafil helps out a lot and he only uses this occasionally "to be ready". He is monogamous with his girlfriend and has no other sexual partners.   Emotional health: no concerns.  Dermatology: He goes to a dermatologist in [place] where he had a full body scan.  GI: He has no problems urinating.   Heart: Pt has no heart problems or NTG at home.  Immunization History  Administered Date(s) Administered  . Influenza-Unspecified 11/09/2011, 01/27/2013, 12/05/2013, 01/20/2015, 12/18/2015  . Tdap 01/20/2015    Health Maintenance  Topic Date Due  . INFLUENZA VACCINE  06/25/2017 (Originally 10/17/2016)  . HIV Screening  01/30/2018 (Originally 04/27/1980)  . COLONOSCOPY  06/21/2022  . TETANUS/TDAP  01/19/2025      Wt Readings from Last 3 Encounters:  04/15/17 220 lb 9.6 oz (100.1 kg)  01/30/17 225 lb (102.1 kg)   BP Readings from Last 3 Encounters:  04/15/17 134/89  01/30/17 118/78   Pulse Readings from Last 3 Encounters:  04/15/17 82  01/30/17 98    Patient Active Problem List    Diagnosis Date Noted  . Current every day smoker-about 20-25-pack-year history. 01/30/2017    Priority: High  . GERD (gastroesophageal reflux disease) 01/30/2017    Priority: Medium  . Family history of diabetes mellitus in father- age 67 01/30/2017    Priority: Low  . ED (erectile dysfunction) 01/30/2017    Priority: Low  . on disability but is very active 02/12/2017  . Inactivity 02/12/2017  . Social drinker 01/30/2017    Past Medical History:  Diagnosis Date  . Erectile dysfunction   . Reflux esophagitis     Past Surgical History:  Procedure Laterality Date  . KNEE SURGERY Left   . WRIST FUSION Left     Family History  Problem Relation Age of Onset  . Diabetes Father   . Congestive Heart Failure Paternal Grandmother     Social History  Substance and Sexual Activity  Drug Use No  ,  Social History   Substance and Sexual Activity  Alcohol Use Yes   Comment: occ.   ,  Social History   Tobacco Use  Smoking Status Current Every Day Smoker  . Packs/day: 0.50  . Types: Cigarettes  Smokeless Tobacco Never Used  Tobacco Comment   patient uses vapor  ,  Social History   Substance and Sexual Activity  Sexual Activity Yes  . Partners: Female    Patient's Medications  New Prescriptions   SILDENAFIL (REVATIO) 20 MG TABLET    Take 1 tablet (20 mg total) by mouth as needed.   ZOSTER VACCINE ADJUVANTED (SHINGRIX) INJECTION    Inject 0.5 mLs into the muscle once for 1 dose.  Previous Medications   FLUTICASONE (FLONASE) 50 MCG/ACT NASAL SPRAY    1 spray each nostril twice daily after sinus rinse using a YR or Milta Deiters med or the like   FLUTICASONE-SALMETEROL (ADVAIR DISKUS) 250-50 MCG/DOSE AEPB    Inhale 1 puff 2 (two) times daily into the lungs.   OMEPRAZOLE (PRILOSEC) 20 MG CAPSULE    Take 1 capsule (20 mg total) by mouth daily.   SILDENAFIL (VIAGRA) 50 MG TABLET    Take 50 mg daily as needed by mouth for erectile dysfunction.  Modified Medications   No  medications on file  Discontinued Medications   AZITHROMYCIN (ZITHROMAX) 250 MG TABLET    Take 2 tablets on day one and 1 tablet daily for next 4 days (days 2 through 5).   CHLORPHENIRAMINE-HYDROCODONE (TUSSIONEX) 10-8 MG/5ML SUER    Take 5 mLs every 12 (twelve) hours as needed by mouth for cough (cough, will cause drowsiness.).    Patient has no allergy information on record.  Review of Systems: General:   Denies fever, chills, unexplained weight loss.  Optho/Auditory:   Denies visual changes, blurred vision/LOV Respiratory:   Denies SOB, DOE more than baseline levels.  Cardiovascular:   Denies chest pain, palpitations, new onset peripheral edema  Gastrointestinal:   Denies nausea, vomiting, diarrhea.  Genitourinary: Denies dysuria, freq/ urgency, flank pain or discharge from genitals.  Endocrine:     Denies hot or cold intolerance, polyuria, polydipsia. Musculoskeletal:   Denies unexplained myalgias, joint swelling, unexplained arthralgias, gait problems.  Skin:  Denies rash, suspicious lesions Neurological:     Denies dizziness, unexplained weakness, numbness  Psychiatric/Behavioral:   Denies mood changes, suicidal or homicidal ideations, hallucinations    Objective:     Blood pressure 134/89, pulse 82, height 6' (1.829 m), weight 220 lb 9.6 oz (100.1 kg). Body mass index is 29.92 kg/m. General Appearance:    Alert, cooperative, no distress, appears stated age  Head:    Normocephalic, without obvious abnormality, atraumatic  Eyes:    PERRL, conjunctiva/corneas clear, EOM's intact, fundi    benign, both eyes  Ears:    Normal TM's and external ear canals, both ears  Nose:   Nares normal, septum midline, mucosa normal, no drainage    or sinus tenderness  Throat:   Lips w/o lesion, mucosa moist, and tongue normal; teeth and   gums normal  Neck:   Supple, symmetrical, trachea midline, no adenopathy;    thyroid:  no enlargement/tenderness/nodules; no carotid   bruit or JVD    Back:     Symmetric, no curvature, ROM normal, no CVA tenderness  Lungs:     Clear to auscultation bilaterally, respirations unlabored, no       Wh/ R/  R  Chest Wall:    No tenderness or gross deformity; normal excursion   Heart:    Regular rate and rhythm, S1 and S2 normal, no murmur, rub   or gallop  Abdomen:     Soft, non-tender, bowel sounds active all four quadrants, NO   G/R/R, no masses, no organomegaly  Genitalia:    Ext genitalia: without lesion, no penile rash or discharge, no hernias appreciated. No hernia, lumps, bumps, rashes, prostate normal,   Rectal:    Normal tone, prostate WNL's and equal b/l, no tenderness; guaiac negative stool - One external, non-inflamed and very small hemorrhoid.  Extremities:   Extremities normal, atraumatic, no cyanosis or gross edema  Pulses:   2+ and symmetric all extremities  Skin:   Warm, dry, Skin color, texture, turgor normal, no obvious rashes or lesions  M-Sk:   Ambulates * 4 w/o difficulty, no gross deformities, tone WNL  Neurologic:   CNII-XII intact, normal strength, sensation and reflexes    Throughout Psych:  No HI/SI, judgement and insight good, Euthymic mood. Full Affect.

## 2017-04-16 LAB — CBC WITH DIFFERENTIAL/PLATELET
BASOS: 1 %
Basophils Absolute: 0.1 10*3/uL (ref 0.0–0.2)
EOS (ABSOLUTE): 0.2 10*3/uL (ref 0.0–0.4)
Eos: 2 %
HEMATOCRIT: 44.2 % (ref 37.5–51.0)
HEMOGLOBIN: 15.1 g/dL (ref 13.0–17.7)
Immature Grans (Abs): 0.1 10*3/uL (ref 0.0–0.1)
Immature Granulocytes: 1 %
LYMPHS ABS: 2.7 10*3/uL (ref 0.7–3.1)
Lymphs: 25 %
MCH: 30.8 pg (ref 26.6–33.0)
MCHC: 34.2 g/dL (ref 31.5–35.7)
MCV: 90 fL (ref 79–97)
MONOCYTES: 10 %
Monocytes Absolute: 1.1 10*3/uL — ABNORMAL HIGH (ref 0.1–0.9)
NEUTROS ABS: 6.6 10*3/uL (ref 1.4–7.0)
Neutrophils: 61 %
Platelets: 327 10*3/uL (ref 150–379)
RBC: 4.9 x10E6/uL (ref 4.14–5.80)
RDW: 14.3 % (ref 12.3–15.4)
WBC: 10.7 10*3/uL (ref 3.4–10.8)

## 2017-04-16 LAB — LIPID PANEL
CHOLESTEROL TOTAL: 178 mg/dL (ref 100–199)
Chol/HDL Ratio: 2.9 ratio (ref 0.0–5.0)
HDL: 61 mg/dL (ref >39)
LDL CALC: 87 mg/dL (ref 0–99)
Triglycerides: 148 mg/dL (ref 0–149)
VLDL CHOLESTEROL CAL: 30 mg/dL (ref 5–40)

## 2017-04-16 LAB — COMPREHENSIVE METABOLIC PANEL
A/G RATIO: 1.6 (ref 1.2–2.2)
ALBUMIN: 4.6 g/dL (ref 3.5–5.5)
ALT: 16 IU/L (ref 0–44)
AST: 15 IU/L (ref 0–40)
Alkaline Phosphatase: 89 IU/L (ref 39–117)
BUN / CREAT RATIO: 18 (ref 9–20)
BUN: 16 mg/dL (ref 6–24)
Bilirubin Total: 0.3 mg/dL (ref 0.0–1.2)
CO2: 24 mmol/L (ref 20–29)
Calcium: 9.4 mg/dL (ref 8.7–10.2)
Chloride: 100 mmol/L (ref 96–106)
Creatinine, Ser: 0.9 mg/dL (ref 0.76–1.27)
GFR, EST AFRICAN AMERICAN: 114 mL/min/{1.73_m2} (ref >59)
GFR, EST NON AFRICAN AMERICAN: 99 mL/min/{1.73_m2} (ref >59)
GLUCOSE: 99 mg/dL (ref 65–99)
Globulin, Total: 2.8 g/dL (ref 1.5–4.5)
Potassium: 4.8 mmol/L (ref 3.5–5.2)
Sodium: 139 mmol/L (ref 134–144)
TOTAL PROTEIN: 7.4 g/dL (ref 6.0–8.5)

## 2017-04-16 LAB — VITAMIN D 25 HYDROXY (VIT D DEFICIENCY, FRACTURES): Vit D, 25-Hydroxy: 36.5 ng/mL (ref 30.0–100.0)

## 2017-04-16 LAB — HIV ANTIBODY (ROUTINE TESTING W REFLEX): HIV Screen 4th Generation wRfx: NONREACTIVE

## 2017-04-16 LAB — HEMOGLOBIN A1C
Est. average glucose Bld gHb Est-mCnc: 111 mg/dL
Hgb A1c MFr Bld: 5.5 % (ref 4.8–5.6)

## 2017-04-16 LAB — HEPATITIS C ANTIBODY: Hep C Virus Ab: >11 s/co ratio — ABNORMAL HIGH (ref 0.0–0.9)

## 2017-04-16 LAB — T4, FREE: Free T4: 1.18 ng/dL (ref 0.82–1.77)

## 2017-04-16 LAB — TSH: TSH: 1.2 u[IU]/mL (ref 0.450–4.500)

## 2017-04-17 ENCOUNTER — Encounter: Payer: Self-pay | Admitting: Internal Medicine

## 2017-04-25 ENCOUNTER — Other Ambulatory Visit: Payer: Self-pay

## 2017-04-25 DIAGNOSIS — R768 Other specified abnormal immunological findings in serum: Secondary | ICD-10-CM

## 2017-04-26 ENCOUNTER — Other Ambulatory Visit: Payer: Medicare Other

## 2017-04-26 ENCOUNTER — Other Ambulatory Visit: Payer: Self-pay

## 2017-04-26 DIAGNOSIS — R768 Other specified abnormal immunological findings in serum: Secondary | ICD-10-CM

## 2017-04-26 MED ORDER — SILDENAFIL CITRATE 20 MG PO TABS: 20.0000 mg | ORAL_TABLET | ORAL | 1 refills | Status: DC | PRN

## 2017-04-26 NOTE — Telephone Encounter (Signed)
Patient requested RX to be printed so he can check prices. MPulliam, CMA/RT(R)

## 2017-04-29 ENCOUNTER — Telehealth: Payer: Self-pay | Admitting: Family Medicine

## 2017-04-29 LAB — HEPATITIS C VRS RNA DETECT BY PCR-QUAL: HCV RNA NAA Qualitative: NEGATIVE

## 2017-04-29 NOTE — Telephone Encounter (Signed)
Unable to leave message, mailbox is full.

## 2017-04-29 NOTE — Telephone Encounter (Signed)
Call patient let him know his recent hepatitis C test was negative.   Tell him his morning follow-up as discussed last office visit

## 2017-04-29 NOTE — Telephone Encounter (Signed)
Patient lost phone and had lab results coming in, please call S/O at [phone] for results

## 2017-04-29 NOTE — Telephone Encounter (Signed)
Patient is calling for lab results. Please advise.  MPulliam, CMA/RT(R)

## 2017-05-01 NOTE — Telephone Encounter (Signed)
Pt informed of results.  Pt expressed understanding and is agreeable.  T. Basim Bartnik, CMA 

## 2017-05-28 DIAGNOSIS — M25521 Pain in right elbow: Secondary | ICD-10-CM | POA: Diagnosis not present

## 2017-05-28 DIAGNOSIS — M7711 Lateral epicondylitis, right elbow: Secondary | ICD-10-CM | POA: Diagnosis not present

## 2017-06-05 ENCOUNTER — Encounter: Payer: Self-pay | Admitting: Internal Medicine

## 2017-06-05 ENCOUNTER — Ambulatory Visit (INDEPENDENT_AMBULATORY_CARE_PROVIDER_SITE_OTHER): Payer: Medicare Other | Admitting: Internal Medicine

## 2017-06-05 VITALS — BP 120/70 | HR 68 | Ht 72.0 in | Wt 224.0 lb

## 2017-06-05 DIAGNOSIS — Z86010 Personal history of colonic polyps: Secondary | ICD-10-CM | POA: Diagnosis not present

## 2017-06-05 DIAGNOSIS — K603 Anal fistula: Secondary | ICD-10-CM
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
## 2017-06-05 NOTE — Patient Instructions (Addendum)
You have been scheduled for a colonoscopy. Please follow written instructions given to you at your visit today.  Please pick up your prep supplies at the pharmacy. If you use inhalers (even only as needed), please bring them with you on the day of your procedure.   I appreciate the opportunity to care for you. Carl Gessner, MD, FACG 

## 2017-06-15 ENCOUNTER — Other Ambulatory Visit: Payer: Self-pay | Admitting: Family Medicine

## 2017-06-15 DIAGNOSIS — F17200 Nicotine dependence, unspecified, uncomplicated: Secondary | ICD-10-CM

## 2017-06-15 DIAGNOSIS — Z833 Family history of diabetes mellitus: Secondary | ICD-10-CM

## 2017-06-15 DIAGNOSIS — Z789 Other specified health status: Secondary | ICD-10-CM

## 2017-06-15 DIAGNOSIS — J441 Chronic obstructive pulmonary disease with (acute) exacerbation: Secondary | ICD-10-CM

## 2017-06-15 DIAGNOSIS — N529 Male erectile dysfunction, unspecified: Secondary | ICD-10-CM

## 2017-06-15 DIAGNOSIS — K219 Gastro-esophageal reflux disease without esophagitis: Secondary | ICD-10-CM

## 2017-07-19 ENCOUNTER — Other Ambulatory Visit: Payer: Self-pay | Admitting: Family Medicine

## 2017-07-19 DIAGNOSIS — K219 Gastro-esophageal reflux disease without esophagitis: Secondary | ICD-10-CM

## 2017-07-19 DIAGNOSIS — N529 Male erectile dysfunction, unspecified: Secondary | ICD-10-CM

## 2017-07-19 DIAGNOSIS — Z833 Family history of diabetes mellitus: Secondary | ICD-10-CM

## 2017-07-19 DIAGNOSIS — Z789 Other specified health status: Secondary | ICD-10-CM

## 2017-07-19 DIAGNOSIS — J441 Chronic obstructive pulmonary disease with (acute) exacerbation: Secondary | ICD-10-CM

## 2017-07-19 DIAGNOSIS — F17200 Nicotine dependence, unspecified, uncomplicated: Secondary | ICD-10-CM

## 2017-08-02 ENCOUNTER — Encounter: Payer: Medicare Other | Admitting: Internal Medicine

## 2017-08-08 NOTE — Telephone Encounter (Signed)
Patient had procedure on 05/2017

## 2017-09-18 DIAGNOSIS — M7711 Lateral epicondylitis, right elbow: Secondary | ICD-10-CM | POA: Diagnosis not present

## 2017-09-18 DIAGNOSIS — M25521 Pain in right elbow: Secondary | ICD-10-CM | POA: Diagnosis not present

## 2017-10-10 ENCOUNTER — Telehealth: Payer: Self-pay | Admitting: Family Medicine

## 2017-10-10 NOTE — Telephone Encounter (Signed)
Patient called states he is out of town & needs Rx refill on    sildenafil (REVATIO) 20 MG tablet [[id number]]   Order Details  Dose: 20 mg Route: Oral Frequency: As needed  Dispense Quantity: 50 tablet Refills: 1 Fills remaining: --        Sig: Take 1 tablet (20 mg total) by mouth as needed     Please send to :  [facility] [place], [place]  Address: [address], [place], [place] [address]                         Phone: [phone].  --Forwarding request to medical assistant.  --Dion Body

## 2017-10-11 ENCOUNTER — Other Ambulatory Visit: Payer: Self-pay

## 2017-10-11 NOTE — Telephone Encounter (Signed)
Patient requested refill on sildenafil.  Last filled on 04/26/17 for # 50 and 1 refill.  LOV 04/15/2017. Please review. MPulliam, CMA/RT(R)

## 2017-10-11 NOTE — Telephone Encounter (Signed)
Sent to Dr. Raliegh Scarlet. MPulliam, CMA/RT(R)

## 2017-10-16 ENCOUNTER — Other Ambulatory Visit: Payer: Self-pay | Admitting: Family Medicine

## 2017-10-16 ENCOUNTER — Telehealth: Payer: Self-pay | Admitting: Family Medicine

## 2017-10-16 DIAGNOSIS — F17200 Nicotine dependence, unspecified, uncomplicated: Secondary | ICD-10-CM

## 2017-10-16 DIAGNOSIS — N529 Male erectile dysfunction, unspecified: Secondary | ICD-10-CM

## 2017-10-16 DIAGNOSIS — Z789 Other specified health status: Secondary | ICD-10-CM

## 2017-10-16 DIAGNOSIS — J441 Chronic obstructive pulmonary disease with (acute) exacerbation: Secondary | ICD-10-CM

## 2017-10-16 DIAGNOSIS — K219 Gastro-esophageal reflux disease without esophagitis: Secondary | ICD-10-CM

## 2017-10-16 DIAGNOSIS — Z833 Family history of diabetes mellitus: Secondary | ICD-10-CM
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
## 2017-10-16 NOTE — Telephone Encounter (Signed)
Noted MPulliam, CMA/RT(R)  

## 2017-10-26 DIAGNOSIS — Z23 Encounter for immunization: Secondary | ICD-10-CM | POA: Diagnosis not present

## 2017-10-31 DIAGNOSIS — M9901 Segmental and somatic dysfunction of cervical region: Secondary | ICD-10-CM | POA: Diagnosis not present

## 2017-10-31 DIAGNOSIS — M62830 Muscle spasm of back: Secondary | ICD-10-CM | POA: Diagnosis not present

## 2017-10-31 DIAGNOSIS — M531 Cervicobrachial syndrome: Secondary | ICD-10-CM | POA: Diagnosis not present

## 2017-10-31 DIAGNOSIS — M9902 Segmental and somatic dysfunction of thoracic region: Secondary | ICD-10-CM | POA: Diagnosis not present

## 2017-11-24 ENCOUNTER — Other Ambulatory Visit: Payer: Self-pay | Admitting: Family Medicine

## 2017-11-24 DIAGNOSIS — F17200 Nicotine dependence, unspecified, uncomplicated: Secondary | ICD-10-CM

## 2017-11-24 DIAGNOSIS — Z833 Family history of diabetes mellitus: Secondary | ICD-10-CM

## 2017-11-24 DIAGNOSIS — J441 Chronic obstructive pulmonary disease with (acute) exacerbation: Secondary | ICD-10-CM

## 2017-11-24 DIAGNOSIS — K219 Gastro-esophageal reflux disease without esophagitis: Secondary | ICD-10-CM

## 2017-11-24 DIAGNOSIS — N529 Male erectile dysfunction, unspecified: Secondary | ICD-10-CM

## 2017-11-24 DIAGNOSIS — Z789 Other specified health status: Secondary | ICD-10-CM

## 2017-11-26 DIAGNOSIS — M7711 Lateral epicondylitis, right elbow: Secondary | ICD-10-CM | POA: Diagnosis not present

## 2017-11-26 DIAGNOSIS — M25521 Pain in right elbow: Secondary | ICD-10-CM | POA: Diagnosis not present

## 2017-12-03 DIAGNOSIS — R030 Elevated blood-pressure reading, without diagnosis of hypertension: Secondary | ICD-10-CM | POA: Diagnosis not present

## 2017-12-03 DIAGNOSIS — Z6830 Body mass index (BMI) 30.0-30.9, adult: Secondary | ICD-10-CM | POA: Diagnosis not present

## 2017-12-03 DIAGNOSIS — T24232A Burn of second degree of left lower leg, initial encounter: Secondary | ICD-10-CM | POA: Diagnosis not present

## 2017-12-09 ENCOUNTER — Telehealth: Payer: Self-pay | Admitting: Family Medicine

## 2017-12-09 ENCOUNTER — Other Ambulatory Visit: Payer: Self-pay | Admitting: Family Medicine

## 2017-12-09 DIAGNOSIS — F17200 Nicotine dependence, unspecified, uncomplicated: Secondary | ICD-10-CM

## 2017-12-09 DIAGNOSIS — N529 Male erectile dysfunction, unspecified: Secondary | ICD-10-CM

## 2017-12-09 DIAGNOSIS — K219 Gastro-esophageal reflux disease without esophagitis: Secondary | ICD-10-CM

## 2017-12-09 DIAGNOSIS — J441 Chronic obstructive pulmonary disease with (acute) exacerbation: Secondary | ICD-10-CM

## 2017-12-09 DIAGNOSIS — Z833 Family history of diabetes mellitus: Secondary | ICD-10-CM

## 2017-12-09 DIAGNOSIS — Z789 Other specified health status: Secondary | ICD-10-CM

## 2017-12-09 NOTE — Telephone Encounter (Signed)
Provider requires pt to have OV before any Rx refill granted--called Pt to set up for Appt, his cell phone  VMB is full unable to leave message.  --FYI to medical assistant that attempts to contact pt unsuccessful. --glh

## 2017-12-09 NOTE — Telephone Encounter (Signed)
Noted MPulliam, CMA/RT(R)  

## 2017-12-10 ENCOUNTER — Other Ambulatory Visit: Payer: Self-pay | Admitting: Family Medicine

## 2017-12-10 DIAGNOSIS — K219 Gastro-esophageal reflux disease without esophagitis: Secondary | ICD-10-CM

## 2017-12-10 DIAGNOSIS — J441 Chronic obstructive pulmonary disease with (acute) exacerbation: Secondary | ICD-10-CM

## 2017-12-10 DIAGNOSIS — N529 Male erectile dysfunction, unspecified: Secondary | ICD-10-CM

## 2017-12-10 DIAGNOSIS — Z833 Family history of diabetes mellitus: Secondary | ICD-10-CM

## 2017-12-10 DIAGNOSIS — F17200 Nicotine dependence, unspecified, uncomplicated: Secondary | ICD-10-CM

## 2017-12-10 DIAGNOSIS — Z789 Other specified health status: Secondary | ICD-10-CM

## 2018-01-02 ENCOUNTER — Telehealth: Payer: Self-pay | Admitting: Family Medicine

## 2018-01-02 NOTE — Telephone Encounter (Signed)
Pat's voice mail box not set up unable to leave msg re: Provider required OV--   --Forwarding msg to medical assistant as Ali Lowe

## 2018-01-02 NOTE — Telephone Encounter (Signed)
Noted MPulliam, CMA/RT(R)  

## 2018-02-11 ENCOUNTER — Telehealth: Payer: Self-pay | Admitting: Family Medicine

## 2018-02-11 NOTE — Telephone Encounter (Signed)
Unable to leave patient message regarding provider required OV-- per recording voicemail box full.  ---forwarding note to medical assistant of attempt.  --glh

## 2018-02-18 DIAGNOSIS — M7711 Lateral epicondylitis, right elbow: Secondary | ICD-10-CM | POA: Diagnosis not present

## 2018-02-18 DIAGNOSIS — M25521 Pain in right elbow: Secondary | ICD-10-CM | POA: Diagnosis not present

## 2018-04-22 DIAGNOSIS — M1711 Unilateral primary osteoarthritis, right knee: Secondary | ICD-10-CM | POA: Diagnosis not present

## 2018-04-22 DIAGNOSIS — M7711 Lateral epicondylitis, right elbow: Secondary | ICD-10-CM | POA: Diagnosis not present

## 2018-04-22 DIAGNOSIS — M25561 Pain in right knee: Secondary | ICD-10-CM | POA: Diagnosis not present

## 2018-04-22 DIAGNOSIS — M25521 Pain in right elbow: Secondary | ICD-10-CM | POA: Diagnosis not present

## 2018-08-13 DIAGNOSIS — M25521 Pain in right elbow: Secondary | ICD-10-CM | POA: Diagnosis not present

## 2018-08-13 DIAGNOSIS — M7711 Lateral epicondylitis, right elbow: Secondary | ICD-10-CM | POA: Diagnosis not present

## 2018-11-12 ENCOUNTER — Ambulatory Visit: Payer: Self-pay | Admitting: Family Medicine

## 2018-12-03 ENCOUNTER — Ambulatory Visit
Admission: EM | Admit: 2018-12-03 | Discharge: 2018-12-03 | Disposition: A | Discharge status: Home / Self Care | Payer: Medicare Other | Attending: Physician Assistant | Admitting: Physician Assistant

## 2018-12-03 ENCOUNTER — Ambulatory Visit (INDEPENDENT_AMBULATORY_CARE_PROVIDER_SITE_OTHER): Payer: Medicare Other

## 2018-12-03 ENCOUNTER — Other Ambulatory Visit: Payer: Self-pay

## 2018-12-03 DIAGNOSIS — L02212 Cutaneous abscess of back [any part, except buttock]: Secondary | ICD-10-CM

## 2018-12-03 DIAGNOSIS — S99911A Unspecified injury of right ankle, initial encounter: Secondary | ICD-10-CM | POA: Diagnosis not present

## 2018-12-03 DIAGNOSIS — M7989 Other specified soft tissue disorders: Secondary | ICD-10-CM | POA: Diagnosis not present

## 2018-12-03 DIAGNOSIS — M25571 Pain in right ankle and joints of right foot: Secondary | ICD-10-CM | POA: Diagnosis not present

## 2018-12-03 MED ORDER — DOXYCYCLINE HYCLATE 100 MG PO CAPS: 100.0000 mg | ORAL_CAPSULE | Freq: Two times a day (BID) | ORAL | 0 refills | Status: DC

## 2018-12-03 MED ORDER — MELOXICAM 7.5 MG PO TABS: 7.5000 mg | ORAL_TABLET | Freq: Every day | ORAL | 0 refills | Status: DC

## 2018-12-03 NOTE — Discharge Instructions (Signed)
Start doxycycline as directed. You can remove current dressing in 24 hours. Keep wound clean and dry. You can clean gently with soap and water. Do not soak area in water. Monitor for spreading redness, increased warmth, increased swelling, fever, follow up for reevaluation needed.  X-ray without any obvious fractures, I will give you a call if radiology read is different. Start Mobic. Do not take ibuprofen (motrin/advil)/ naproxen (aleve) while on mobic.  Ice compress, elevation, ankle brace during activity.  Follow-up with PCP for further evaluation if symptoms not improving.

## 2018-12-03 NOTE — ED Triage Notes (Signed)
Pt presents to UC w/ c/o "boil" on upper back x1 week. Pt reports it is painful for his job duties. Pt also rolled a large piece of equipment over his right foot x1 week and his ankle has been swollen. Pt is able to walk on it but it is painful. Pt reports taking aleve at night to help with pain.

## 2018-12-03 NOTE — ED Notes (Signed)
Patient able to ambulate independently  
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
## 2018-12-04 ENCOUNTER — Telehealth: Payer: Self-pay

## 2018-12-23 DIAGNOSIS — M7711 Lateral epicondylitis, right elbow: Secondary | ICD-10-CM | POA: Diagnosis not present

## 2018-12-23 DIAGNOSIS — M25521 Pain in right elbow: Secondary | ICD-10-CM | POA: Diagnosis not present

## 2018-12-27 DIAGNOSIS — Z23 Encounter for immunization: Secondary | ICD-10-CM | POA: Diagnosis not present

## 2019-02-11 ENCOUNTER — Inpatient Hospital Stay (HOSPITAL_COMMUNITY)
Admission: EM | Admit: 2019-02-11 | Discharge: 2019-02-17 | DRG: 308 | Disposition: E | Payer: Medicare Other | Attending: Pulmonary Disease | Admitting: Pulmonary Disease

## 2019-02-11 ENCOUNTER — Encounter (HOSPITAL_COMMUNITY): Payer: Self-pay | Admitting: Emergency Medicine

## 2019-02-11 ENCOUNTER — Emergency Department (HOSPITAL_COMMUNITY): Payer: Medicare Other

## 2019-02-11 DIAGNOSIS — R402112 Coma scale, eyes open, never, at arrival to emergency department: Secondary | ICD-10-CM | POA: Diagnosis present

## 2019-02-11 DIAGNOSIS — Z01818 Encounter for other preprocedural examination: Secondary | ICD-10-CM

## 2019-02-11 DIAGNOSIS — Z0184 Encounter for antibody response examination: Secondary | ICD-10-CM

## 2019-02-11 DIAGNOSIS — Z515 Encounter for palliative care: Secondary | ICD-10-CM | POA: Diagnosis not present

## 2019-02-11 DIAGNOSIS — G253 Myoclonus: Secondary | ICD-10-CM | POA: Diagnosis present

## 2019-02-11 DIAGNOSIS — Z789 Other specified health status: Secondary | ICD-10-CM | POA: Diagnosis present

## 2019-02-11 DIAGNOSIS — R069 Unspecified abnormalities of breathing: Secondary | ICD-10-CM

## 2019-02-11 DIAGNOSIS — Z833 Family history of diabetes mellitus: Secondary | ICD-10-CM

## 2019-02-11 DIAGNOSIS — J9811 Atelectasis: Secondary | ICD-10-CM | POA: Diagnosis not present

## 2019-02-11 DIAGNOSIS — F1290 Cannabis use, unspecified, uncomplicated: Secondary | ICD-10-CM | POA: Diagnosis present

## 2019-02-11 DIAGNOSIS — Z66 Do not resuscitate: Secondary | ICD-10-CM | POA: Diagnosis present

## 2019-02-11 DIAGNOSIS — J9601 Acute respiratory failure with hypoxia: Secondary | ICD-10-CM | POA: Diagnosis present

## 2019-02-11 DIAGNOSIS — I472 Ventricular tachycardia: Principal | ICD-10-CM | POA: Diagnosis present

## 2019-02-11 DIAGNOSIS — R402212 Coma scale, best verbal response, none, at arrival to emergency department: Secondary | ICD-10-CM | POA: Diagnosis present

## 2019-02-11 DIAGNOSIS — Z8249 Family history of ischemic heart disease and other diseases of the circulatory system: Secondary | ICD-10-CM

## 2019-02-11 DIAGNOSIS — R918 Other nonspecific abnormal finding of lung field: Secondary | ICD-10-CM

## 2019-02-11 DIAGNOSIS — I469 Cardiac arrest, cause unspecified: Secondary | ICD-10-CM

## 2019-02-11 DIAGNOSIS — R569 Unspecified convulsions: Secondary | ICD-10-CM | POA: Diagnosis not present

## 2019-02-11 DIAGNOSIS — N179 Acute kidney failure, unspecified: Secondary | ICD-10-CM | POA: Diagnosis present

## 2019-02-11 DIAGNOSIS — N529 Male erectile dysfunction, unspecified: Secondary | ICD-10-CM | POA: Diagnosis present

## 2019-02-11 DIAGNOSIS — R739 Hyperglycemia, unspecified: Secondary | ICD-10-CM | POA: Diagnosis present

## 2019-02-11 DIAGNOSIS — F17210 Nicotine dependence, cigarettes, uncomplicated: Secondary | ICD-10-CM | POA: Diagnosis present

## 2019-02-11 DIAGNOSIS — E872 Acidosis, unspecified: Secondary | ICD-10-CM

## 2019-02-11 DIAGNOSIS — Z20828 Contact with and (suspected) exposure to other viral communicable diseases: Secondary | ICD-10-CM | POA: Diagnosis present

## 2019-02-11 DIAGNOSIS — R4020 Unspecified coma: Secondary | ICD-10-CM | POA: Diagnosis not present

## 2019-02-11 DIAGNOSIS — Z79899 Other long term (current) drug therapy: Secondary | ICD-10-CM

## 2019-02-11 DIAGNOSIS — S2241XA Multiple fractures of ribs, right side, initial encounter for closed fracture: Secondary | ICD-10-CM | POA: Diagnosis not present

## 2019-02-11 DIAGNOSIS — R579 Shock, unspecified: Secondary | ICD-10-CM | POA: Diagnosis present

## 2019-02-11 DIAGNOSIS — S2243XA Multiple fractures of ribs, bilateral, initial encounter for closed fracture: Secondary | ICD-10-CM | POA: Diagnosis present

## 2019-02-11 DIAGNOSIS — S27329A Contusion of lung, unspecified, initial encounter: Secondary | ICD-10-CM | POA: Diagnosis present

## 2019-02-11 DIAGNOSIS — K2100 Gastro-esophageal reflux disease with esophagitis, without bleeding: Secondary | ICD-10-CM | POA: Diagnosis present

## 2019-02-11 DIAGNOSIS — R680 Hypothermia, not associated with low environmental temperature: Secondary | ICD-10-CM | POA: Diagnosis not present

## 2019-02-11 DIAGNOSIS — R402312 Coma scale, best motor response, none, at arrival to emergency department: Secondary | ICD-10-CM | POA: Diagnosis present

## 2019-02-11 DIAGNOSIS — K5790 Diverticulosis of intestine, part unspecified, without perforation or abscess without bleeding: Secondary | ICD-10-CM | POA: Diagnosis not present

## 2019-02-11 DIAGNOSIS — Z419 Encounter for procedure for purposes other than remedying health state, unspecified: Secondary | ICD-10-CM

## 2019-02-11 DIAGNOSIS — I4891 Unspecified atrial fibrillation: Secondary | ICD-10-CM | POA: Diagnosis present

## 2019-02-11 DIAGNOSIS — F1910 Other psychoactive substance abuse, uncomplicated: Secondary | ICD-10-CM | POA: Diagnosis present

## 2019-02-11 DIAGNOSIS — Z981 Arthrodesis status: Secondary | ICD-10-CM

## 2019-02-11 DIAGNOSIS — G931 Anoxic brain damage, not elsewhere classified: Secondary | ICD-10-CM | POA: Diagnosis present

## 2019-02-11 DIAGNOSIS — F17200 Nicotine dependence, unspecified, uncomplicated: Secondary | ICD-10-CM | POA: Diagnosis present

## 2019-02-11 DIAGNOSIS — Z7289 Other problems related to lifestyle: Secondary | ICD-10-CM

## 2019-02-11 DIAGNOSIS — J9690 Respiratory failure, unspecified, unspecified whether with hypoxia or hypercapnia: Secondary | ICD-10-CM

## 2019-02-11 DIAGNOSIS — Z8719 Personal history of other diseases of the digestive system: Secondary | ICD-10-CM

## 2019-02-11 DIAGNOSIS — F1490 Cocaine use, unspecified, uncomplicated: Secondary | ICD-10-CM

## 2019-02-11 MED ORDER — FENTANYL CITRATE (PF) 100 MCG/2ML IJ SOLN
INTRAMUSCULAR | Status: AC | PRN
  Administered 2019-02-11: 50 ug via INTRAVENOUS

## 2019-02-11 MED ORDER — SODIUM CHLORIDE 0.9 % IV BOLUS
1000.0000 mL | Freq: Once | INTRAVENOUS | Status: AC
Start: 2019-02-12 — End: 2019-02-12
  Administered 2019-02-12: 1000 mL via INTRAVENOUS

## 2019-02-11 MED ORDER — EPINEPHRINE 1 MG/10ML IJ SOSY
PREFILLED_SYRINGE | INTRAMUSCULAR | Status: AC | PRN
  Administered 2019-02-11: 1 mg via INTRAVENOUS

## 2019-02-11 MED ORDER — FENTANYL CITRATE (PF) 100 MCG/2ML IJ SOLN
INTRAMUSCULAR | Status: AC
  Filled 2019-02-11: qty 2

## 2019-02-11 MED ORDER — SODIUM CHLORIDE 0.9 % IV BOLUS
1000.0000 mL | Freq: Once | INTRAVENOUS | Status: AC
  Administered 2019-02-12 (×2): 1000 mL via INTRAVENOUS

## 2019-02-11 MED ORDER — SODIUM CHLORIDE 0.9 % IV SOLN
INTRAVENOUS | Status: DC
  Administered 2019-02-12 – 2019-02-13 (×3): via INTRAVENOUS

## 2019-02-11 MED ORDER — SODIUM CHLORIDE 0.9 % IV BOLUS
1000.0000 mL | Freq: Once | INTRAVENOUS | Status: AC
  Administered 2019-02-12: 1000 mL via INTRAVENOUS

## 2019-02-11 MED ORDER — MIDAZOLAM HCL 2 MG/2ML IJ SOLN
INTRAMUSCULAR | Status: AC
  Filled 2019-02-11: qty 2

## 2019-02-11 NOTE — Procedures (Signed)
Intubation Procedure Note Charles Martin [id number] 09-16-1965  Procedure: Intubation Indications: Airway protection and maintenance  Procedure Details Consent: Unable to obtain consent because of emergent medical necessity. Time Out: Verified patient identification, verified procedure, site/side was marked, verified correct patient position, special equipment/implants available, medications/allergies/relevent history reviewed, required imaging and test results available.  Performed  Maximum sterile technique was used including cap, gloves, hand hygiene and mask.  MAC and 3    Evaluation Hemodynamic Status: BP stable throughout; O2 sats: stable throughout Patient's Current Condition: stable Complications: No apparent complications Patient did tolerate procedure well. Chest X-ray ordered to verify placement.  CXR: pending.   Milinda Cave 02/09/2019
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
## 2019-02-12 ENCOUNTER — Inpatient Hospital Stay (HOSPITAL_COMMUNITY): Payer: Medicare Other

## 2019-02-12 ENCOUNTER — Emergency Department (HOSPITAL_COMMUNITY): Payer: Medicare Other

## 2019-02-12 DIAGNOSIS — I472 Ventricular tachycardia: Secondary | ICD-10-CM | POA: Diagnosis present

## 2019-02-12 DIAGNOSIS — G931 Anoxic brain damage, not elsewhere classified: Secondary | ICD-10-CM | POA: Diagnosis not present

## 2019-02-12 DIAGNOSIS — Z20828 Contact with and (suspected) exposure to other viral communicable diseases: Secondary | ICD-10-CM | POA: Diagnosis present

## 2019-02-12 DIAGNOSIS — R402212 Coma scale, best verbal response, none, at arrival to emergency department: Secondary | ICD-10-CM | POA: Diagnosis present

## 2019-02-12 DIAGNOSIS — K5790 Diverticulosis of intestine, part unspecified, without perforation or abscess without bleeding: Secondary | ICD-10-CM | POA: Diagnosis not present

## 2019-02-12 DIAGNOSIS — E872 Acidosis: Secondary | ICD-10-CM | POA: Diagnosis present

## 2019-02-12 DIAGNOSIS — R9431 Abnormal electrocardiogram [ECG] [EKG]: Secondary | ICD-10-CM

## 2019-02-12 DIAGNOSIS — Z9911 Dependence on respirator [ventilator] status: Secondary | ICD-10-CM | POA: Diagnosis not present

## 2019-02-12 DIAGNOSIS — S2243XA Multiple fractures of ribs, bilateral, initial encounter for closed fracture: Secondary | ICD-10-CM | POA: Diagnosis present

## 2019-02-12 DIAGNOSIS — R918 Other nonspecific abnormal finding of lung field: Secondary | ICD-10-CM | POA: Diagnosis not present

## 2019-02-12 DIAGNOSIS — R4020 Unspecified coma: Secondary | ICD-10-CM | POA: Diagnosis not present

## 2019-02-12 DIAGNOSIS — F1490 Cocaine use, unspecified, uncomplicated: Secondary | ICD-10-CM | POA: Diagnosis not present

## 2019-02-12 DIAGNOSIS — Z0184 Encounter for antibody response examination: Secondary | ICD-10-CM | POA: Diagnosis not present

## 2019-02-12 DIAGNOSIS — R402312 Coma scale, best motor response, none, at arrival to emergency department: Secondary | ICD-10-CM | POA: Diagnosis present

## 2019-02-12 DIAGNOSIS — I469 Cardiac arrest, cause unspecified: Secondary | ICD-10-CM | POA: Diagnosis present

## 2019-02-12 DIAGNOSIS — R9401 Abnormal electroencephalogram [EEG]: Secondary | ICD-10-CM | POA: Diagnosis not present

## 2019-02-12 DIAGNOSIS — G253 Myoclonus: Secondary | ICD-10-CM | POA: Diagnosis not present

## 2019-02-12 DIAGNOSIS — R579 Shock, unspecified: Secondary | ICD-10-CM | POA: Diagnosis present

## 2019-02-12 DIAGNOSIS — S27329A Contusion of lung, unspecified, initial encounter: Secondary | ICD-10-CM | POA: Diagnosis present

## 2019-02-12 DIAGNOSIS — J9811 Atelectasis: Secondary | ICD-10-CM | POA: Diagnosis not present

## 2019-02-12 DIAGNOSIS — I4891 Unspecified atrial fibrillation: Secondary | ICD-10-CM | POA: Diagnosis not present

## 2019-02-12 DIAGNOSIS — Z515 Encounter for palliative care: Secondary | ICD-10-CM | POA: Diagnosis not present

## 2019-02-12 DIAGNOSIS — J9601 Acute respiratory failure with hypoxia: Secondary | ICD-10-CM | POA: Diagnosis not present

## 2019-02-12 DIAGNOSIS — N179 Acute kidney failure, unspecified: Secondary | ICD-10-CM | POA: Diagnosis present

## 2019-02-12 DIAGNOSIS — J9690 Respiratory failure, unspecified, unspecified whether with hypoxia or hypercapnia: Secondary | ICD-10-CM | POA: Diagnosis not present

## 2019-02-12 DIAGNOSIS — Z66 Do not resuscitate: Secondary | ICD-10-CM | POA: Diagnosis present

## 2019-02-12 DIAGNOSIS — G40409 Other generalized epilepsy and epileptic syndromes, not intractable, without status epilepticus: Secondary | ICD-10-CM

## 2019-02-12 DIAGNOSIS — F1910 Other psychoactive substance abuse, uncomplicated: Secondary | ICD-10-CM | POA: Diagnosis present

## 2019-02-12 DIAGNOSIS — S2241XA Multiple fractures of ribs, right side, initial encounter for closed fracture: Secondary | ICD-10-CM | POA: Diagnosis not present

## 2019-02-12 DIAGNOSIS — R569 Unspecified convulsions: Secondary | ICD-10-CM | POA: Diagnosis not present

## 2019-02-12 DIAGNOSIS — R402112 Coma scale, eyes open, never, at arrival to emergency department: Secondary | ICD-10-CM | POA: Diagnosis present

## 2019-02-12 LAB — POCT I-STAT 7, (LYTES, BLD GAS, ICA,H+H)
Acid-base deficit: 11 mmol/L — ABNORMAL HIGH (ref 0.0–2.0)
Acid-base deficit: 4 mmol/L — ABNORMAL HIGH (ref 0.0–2.0)
Bicarbonate: 17.5 mmol/L — ABNORMAL LOW (ref 20.0–28.0)
Bicarbonate: 20.8 mmol/L (ref 20.0–28.0)
Calcium, Ion: 1.04 mmol/L — ABNORMAL LOW (ref 1.15–1.40)
Calcium, Ion: 1.05 mmol/L — ABNORMAL LOW (ref 1.15–1.40)
HCT: 30 % — ABNORMAL LOW (ref 39.0–52.0)
HCT: 33 % — ABNORMAL LOW (ref 39.0–52.0)
Hemoglobin: 10.2 g/dL — ABNORMAL LOW (ref 13.0–17.0)
Hemoglobin: 11.2 g/dL — ABNORMAL LOW (ref 13.0–17.0)
O2 Saturation: 100 %
O2 Saturation: 98 %
Patient temperature: 95.5
Patient temperature: 98
Potassium: 3 mmol/L — ABNORMAL LOW (ref 3.5–5.1)
Potassium: 4.1 mmol/L (ref 3.5–5.1)
Sodium: 139 mmol/L (ref 135–145)
Sodium: 140 mmol/L (ref 135–145)
TCO2: 19 mmol/L — ABNORMAL LOW (ref 22–32)
TCO2: 22 mmol/L (ref 22–32)
pCO2 arterial: 34.4 mmHg (ref 32.0–48.0)
pCO2 arterial: 45.6 mmHg (ref 32.0–48.0)
pH, Arterial: 7.182 — CL (ref 7.350–7.450)
pH, Arterial: 7.387 (ref 7.350–7.450)
pO2, Arterial: 103 mmHg (ref 83.0–108.0)
pO2, Arterial: 381 mmHg — ABNORMAL HIGH (ref 83.0–108.0)

## 2019-02-12 LAB — MAGNESIUM
Magnesium: 2.5 mg/dL — ABNORMAL HIGH (ref 1.7–2.4)
Magnesium: 1.9 mg/dL (ref 1.7–2.4)

## 2019-02-12 LAB — I-STAT CHEM 8, ED
BUN: 18 mg/dL (ref 6–20)
Calcium, Ion: 1.09 mmol/L — ABNORMAL LOW (ref 1.15–1.40)
Chloride: 104 mmol/L (ref 98–111)
Creatinine, Ser: 1.5 mg/dL — ABNORMAL HIGH (ref 0.61–1.24)
Glucose, Bld: 215 mg/dL — ABNORMAL HIGH (ref 70–99)
HCT: 43 % (ref 39.0–52.0)
Hemoglobin: 14.6 g/dL (ref 13.0–17.0)
Potassium: 3.8 mmol/L (ref 3.5–5.1)
Sodium: 137 mmol/L (ref 135–145)
TCO2: 20 mmol/L — ABNORMAL LOW (ref 22–32)

## 2019-02-12 LAB — CBC
HCT: 30.7 % — ABNORMAL LOW (ref 39.0–52.0)
HCT: 31.3 % — ABNORMAL LOW (ref 39.0–52.0)
HCT: 36.1 % — ABNORMAL LOW (ref 39.0–52.0)
HCT: 43.7 % (ref 39.0–52.0)
Hemoglobin: 10.4 g/dL — ABNORMAL LOW (ref 13.0–17.0)
Hemoglobin: 10.7 g/dL — ABNORMAL LOW (ref 13.0–17.0)
Hemoglobin: 11.9 g/dL — ABNORMAL LOW (ref 13.0–17.0)
Hemoglobin: 13.9 g/dL (ref 13.0–17.0)
MCH: 30.7 pg (ref 26.0–34.0)
MCH: 30.7 pg (ref 26.0–34.0)
MCH: 31 pg (ref 26.0–34.0)
MCH: 31.1 pg (ref 26.0–34.0)
MCHC: 31.8 g/dL (ref 30.0–36.0)
MCHC: 33 g/dL (ref 30.0–36.0)
MCHC: 33.9 g/dL (ref 30.0–36.0)
MCHC: 34.2 g/dL (ref 30.0–36.0)
MCV: 91 fL (ref 80.0–100.0)
MCV: 91.6 fL (ref 80.0–100.0)
MCV: 93.3 fL (ref 80.0–100.0)
MCV: 96.5 fL (ref 80.0–100.0)
Platelets: 161 10*3/uL (ref 150–400)
Platelets: 187 10*3/uL (ref 150–400)
Platelets: 214 10*3/uL (ref 150–400)
Platelets: 256 10*3/uL (ref 150–400)
RBC: 3.35 MIL/uL — ABNORMAL LOW (ref 4.22–5.81)
RBC: 3.44 MIL/uL — ABNORMAL LOW (ref 4.22–5.81)
RBC: 3.87 MIL/uL — ABNORMAL LOW (ref 4.22–5.81)
RBC: 4.53 MIL/uL (ref 4.22–5.81)
RDW: 13.3 % (ref 11.5–15.5)
RDW: 13.3 % (ref 11.5–15.5)
RDW: 13.4 % (ref 11.5–15.5)
RDW: 13.6 % (ref 11.5–15.5)
WBC: 11.8 10*3/uL — ABNORMAL HIGH (ref 4.0–10.5)
WBC: 7.5 10*3/uL (ref 4.0–10.5)
WBC: 8.4 10*3/uL (ref 4.0–10.5)
WBC: 8.8 10*3/uL (ref 4.0–10.5)
nRBC: 0 % (ref 0.0–0.2)
nRBC: 0 % (ref 0.0–0.2)
nRBC: 0 % (ref 0.0–0.2)
nRBC: 0 % (ref 0.0–0.2)

## 2019-02-12 LAB — COMPREHENSIVE METABOLIC PANEL
ALT: 34 U/L (ref 0–44)
AST: 42 U/L — ABNORMAL HIGH (ref 15–41)
Albumin: 4.1 g/dL (ref 3.5–5.0)
Alkaline Phosphatase: 66 U/L (ref 38–126)
Anion gap: 19 — ABNORMAL HIGH (ref 5–15)
BUN: 16 mg/dL (ref 6–20)
CO2: 15 mmol/L — ABNORMAL LOW (ref 22–32)
Calcium: 8.8 mg/dL — ABNORMAL LOW (ref 8.9–10.3)
Chloride: 103 mmol/L (ref 98–111)
Creatinine, Ser: 1.56 mg/dL — ABNORMAL HIGH (ref 0.61–1.24)
GFR calc Af Amer: 58 mL/min — ABNORMAL LOW (ref >60)
GFR calc non Af Amer: 50 mL/min — ABNORMAL LOW (ref >60)
Glucose, Bld: 220 mg/dL — ABNORMAL HIGH (ref 70–99)
Potassium: 4 mmol/L (ref 3.5–5.1)
Sodium: 137 mmol/L (ref 135–145)
Total Bilirubin: 0.4 mg/dL (ref 0.3–1.2)
Total Protein: 6.8 g/dL (ref 6.5–8.1)

## 2019-02-12 LAB — PROTIME-INR
INR: 1 (ref 0.8–1.2)
Prothrombin Time: 12.9 seconds (ref 11.4–15.2)

## 2019-02-12 LAB — BASIC METABOLIC PANEL
Anion gap: 10 (ref 5–15)
BUN: 14 mg/dL (ref 6–20)
CO2: 21 mmol/L — ABNORMAL LOW (ref 22–32)
Calcium: 7.4 mg/dL — ABNORMAL LOW (ref 8.9–10.3)
Chloride: 108 mmol/L (ref 98–111)
Creatinine, Ser: 1.1 mg/dL (ref 0.61–1.24)
GFR calc Af Amer: >60 mL/min (ref >60)
GFR calc non Af Amer: >60 mL/min (ref >60)
Glucose, Bld: 124 mg/dL — ABNORMAL HIGH (ref 70–99)
Potassium: 4.2 mmol/L (ref 3.5–5.1)
Sodium: 139 mmol/L (ref 135–145)

## 2019-02-12 LAB — RAPID URINE DRUG SCREEN, HOSP PERFORMED
Amphetamines: NOT DETECTED
Barbiturates: NOT DETECTED
Benzodiazepines: POSITIVE — AB
Cocaine: POSITIVE — AB
Opiates: NOT DETECTED
Tetrahydrocannabinol: POSITIVE — AB

## 2019-02-12 LAB — SARS CORONAVIRUS 2 BY RT PCR (HOSPITAL ORDER, PERFORMED IN ~~LOC~~ HOSPITAL LAB): SARS Coronavirus 2: NEGATIVE

## 2019-02-12 LAB — HIV ANTIBODY (ROUTINE TESTING W REFLEX): HIV Screen 4th Generation wRfx: NONREACTIVE

## 2019-02-12 LAB — HEMOGLOBIN A1C
Hgb A1c MFr Bld: 5.6 % (ref 4.8–5.6)
Mean Plasma Glucose: 114.02 mg/dL

## 2019-02-12 LAB — ECHOCARDIOGRAM COMPLETE
Height: 72 in
Weight: 3520 oz

## 2019-02-12 LAB — URINALYSIS, ROUTINE W REFLEX MICROSCOPIC
Bilirubin Urine: NEGATIVE
Glucose, UA: 50 mg/dL — AB
Ketones, ur: NEGATIVE mg/dL
Leukocytes,Ua: NEGATIVE
Nitrite: NEGATIVE
Protein, ur: NEGATIVE mg/dL
Specific Gravity, Urine: 1.03 (ref 1.005–1.030)
pH: 5 (ref 5.0–8.0)

## 2019-02-12 LAB — TROPONIN I (HIGH SENSITIVITY)
Troponin I (High Sensitivity): 6 ng/L (ref <18)
Troponin I (High Sensitivity): 37 ng/L — ABNORMAL HIGH (ref <18)
Troponin I (High Sensitivity): 74 ng/L — ABNORMAL HIGH (ref <18)

## 2019-02-12 LAB — GLUCOSE, CAPILLARY
Glucose-Capillary: 132 mg/dL — ABNORMAL HIGH (ref 70–99)
Glucose-Capillary: 80 mg/dL (ref 70–99)
Glucose-Capillary: 81 mg/dL (ref 70–99)
Glucose-Capillary: 84 mg/dL (ref 70–99)
Glucose-Capillary: 86 mg/dL (ref 70–99)
Glucose-Capillary: 86 mg/dL (ref 70–99)

## 2019-02-12 LAB — LACTIC ACID, PLASMA
Lactic Acid, Venous: 6.7 mmol/L (ref 0.5–1.9)
Lactic Acid, Venous: 3.4 mmol/L (ref 0.5–1.9)
Lactic Acid, Venous: 2.9 mmol/L (ref 0.5–1.9)

## 2019-02-12 LAB — TRIGLYCERIDES: Triglycerides: 72 mg/dL (ref <150)

## 2019-02-12 LAB — PHOSPHORUS: Phosphorus: 3.2 mg/dL (ref 2.5–4.6)

## 2019-02-12 LAB — MRSA PCR SCREENING: MRSA by PCR: NEGATIVE

## 2019-02-12 LAB — CBG MONITORING, ED: Glucose-Capillary: 199 mg/dL — ABNORMAL HIGH (ref 70–99)

## 2019-02-12 LAB — POC SARS CORONAVIRUS 2 AG -  ED: SARS Coronavirus 2 Ag: NEGATIVE

## 2019-02-12 MED ORDER — ACETAMINOPHEN 325 MG PO TABS: 650.0000 mg | ORAL_TABLET | ORAL | Status: DC | PRN

## 2019-02-12 MED ORDER — PIPERACILLIN-TAZOBACTAM 3.375 G IVPB
3.3750 g | Freq: Three times a day (TID) | INTRAVENOUS | Status: DC
  Administered 2019-02-12 – 2019-02-15 (×9): 3.375 g via INTRAVENOUS
  Filled 2019-02-12 (×8): qty 50

## 2019-02-12 MED ORDER — NOREPINEPHRINE BITARTRATE 1 MG/ML IV SOLN
INTRAVENOUS | Status: AC | PRN
  Administered 2019-02-11: 2 ug via INTRAVENOUS

## 2019-02-12 MED ORDER — PROPOFOL 1000 MG/100ML IV EMUL
5.0000 ug/kg/min | INTRAVENOUS | Status: DC
  Administered 2019-02-12: 60 ug/kg/min via INTRAVENOUS
  Administered 2019-02-12: 20 ug/kg/min via INTRAVENOUS
  Administered 2019-02-12: 50 ug/kg/min via INTRAVENOUS
  Administered 2019-02-12 (×2): 60 ug/kg/min via INTRAVENOUS
  Administered 2019-02-13 (×2): 70 ug/kg/min via INTRAVENOUS
  Administered 2019-02-13 (×3): 60 ug/kg/min via INTRAVENOUS
  Administered 2019-02-13 – 2019-02-14 (×5): 70 ug/kg/min via INTRAVENOUS
  Administered 2019-02-14: 40 ug/kg/min via INTRAVENOUS
  Administered 2019-02-14 (×2): 60 ug/kg/min via INTRAVENOUS
  Administered 2019-02-14: 40 ug/kg/min via INTRAVENOUS
  Administered 2019-02-14 (×2): 60 ug/kg/min via INTRAVENOUS
  Administered 2019-02-15: 50 ug/kg/min via INTRAVENOUS
  Administered 2019-02-15 (×2): 80 ug/kg/min via INTRAVENOUS
  Administered 2019-02-15: 65 ug/kg/min via INTRAVENOUS
  Administered 2019-02-15: 60 ug/kg/min via INTRAVENOUS
  Filled 2019-02-12: qty 200
  Filled 2019-02-12 (×5): qty 100
  Filled 2019-02-12: qty 200
  Filled 2019-02-12: qty 100
  Filled 2019-02-12 (×3): qty 200
  Filled 2019-02-12 (×3): qty 100
  Filled 2019-02-12: qty 200
  Filled 2019-02-12 (×8): qty 100

## 2019-02-12 MED ORDER — LEVETIRACETAM IN NACL 1000 MG/100ML IV SOLN
1000.0000 mg | Freq: Two times a day (BID) | INTRAVENOUS | Status: DC
  Administered 2019-02-12 – 2019-02-15 (×7): 1000 mg via INTRAVENOUS
  Filled 2019-02-12 (×7): qty 100

## 2019-02-12 MED ORDER — FENTANYL 2500MCG IN NS 250ML (10MCG/ML) PREMIX INFUSION
50.0000 ug/h | INTRAVENOUS | Status: DC
  Administered 2019-02-12 – 2019-02-15 (×3): 50 ug/h via INTRAVENOUS
  Filled 2019-02-12 (×3): qty 250

## 2019-02-12 MED ORDER — FENTANYL CITRATE (PF) 100 MCG/2ML IJ SOLN: 100.0000 ug | INTRAMUSCULAR | Status: DC | PRN

## 2019-02-12 MED ORDER — IOHEXOL 350 MG/ML SOLN
100.0000 mL | Freq: Once | INTRAVENOUS | Status: AC | PRN
  Administered 2019-02-12: 100 mL via INTRAVENOUS

## 2019-02-12 MED ORDER — PANTOPRAZOLE SODIUM 40 MG IV SOLR
40.0000 mg | Freq: Two times a day (BID) | INTRAVENOUS | Status: DC
  Administered 2019-02-12 – 2019-02-15 (×8): 40 mg via INTRAVENOUS
  Filled 2019-02-12 (×8): qty 40

## 2019-02-12 MED ORDER — CHLORHEXIDINE GLUCONATE 0.12% ORAL RINSE (MEDLINE KIT)
15.0000 mL | Freq: Two times a day (BID) | OROMUCOSAL | Status: DC
  Administered 2019-02-12 – 2019-02-15 (×7): 15 mL via OROMUCOSAL

## 2019-02-12 MED ORDER — MIDAZOLAM HCL 2 MG/2ML IJ SOLN
INTRAMUSCULAR | Status: AC
  Filled 2019-02-12: qty 4

## 2019-02-12 MED ORDER — AMIODARONE HCL IN DEXTROSE 360-4.14 MG/200ML-% IV SOLN
60.0000 mg/h | INTRAVENOUS | Status: DC
  Administered 2019-02-12: 60 mg/h via INTRAVENOUS
  Filled 2019-02-12 (×2): qty 200

## 2019-02-12 MED ORDER — INSULIN ASPART 100 UNIT/ML ~~LOC~~ SOLN
2.0000 [IU] | SUBCUTANEOUS | Status: DC
  Administered 2019-02-12: 2 [IU] via SUBCUTANEOUS

## 2019-02-12 MED ORDER — AMIODARONE HCL IN DEXTROSE 360-4.14 MG/200ML-% IV SOLN
30.0000 mg/h | INTRAVENOUS | Status: DC
  Administered 2019-02-12 (×3): 30 mg/h via INTRAVENOUS
  Filled 2019-02-12 (×3): qty 200

## 2019-02-12 MED ORDER — STERILE WATER FOR INJECTION IV SOLN
INTRAVENOUS | Status: DC
  Administered 2019-02-12 – 2019-02-13 (×4): via INTRAVENOUS
  Filled 2019-02-12 (×7): qty 850

## 2019-02-12 MED ORDER — HEPARIN SODIUM (PORCINE) 5000 UNIT/ML IJ SOLN
5000.0000 [IU] | Freq: Three times a day (TID) | INTRAMUSCULAR | Status: DC
  Administered 2019-02-12 – 2019-02-15 (×10): 5000 [IU] via SUBCUTANEOUS
  Filled 2019-02-12 (×11): qty 1

## 2019-02-12 MED ORDER — MIDAZOLAM HCL 2 MG/2ML IJ SOLN
2.0000 mg | INTRAMUSCULAR | Status: DC | PRN
  Administered 2019-02-12: 1 mg via INTRAVENOUS

## 2019-02-12 MED ORDER — NOREPINEPHRINE 4 MG/250ML-% IV SOLN
2.0000 ug/min | INTRAVENOUS | Status: DC
  Administered 2019-02-12: 3 ug/min via INTRAVENOUS
  Filled 2019-02-12: qty 250

## 2019-02-12 MED ORDER — SODIUM CHLORIDE 0.9 % IV SOLN
2000.0000 mg | Freq: Once | INTRAVENOUS | Status: AC
  Administered 2019-02-12: 2000 mg via INTRAVENOUS
  Filled 2019-02-12: qty 20

## 2019-02-12 MED ORDER — VALPROATE SODIUM 500 MG/5ML IV SOLN
2000.0000 mg | Freq: Once | INTRAVENOUS | Status: AC
  Administered 2019-02-12: 2000 mg via INTRAVENOUS
  Filled 2019-02-12: qty 20

## 2019-02-12 MED ORDER — ORAL CARE MOUTH RINSE
15.0000 mL | OROMUCOSAL | Status: DC
  Administered 2019-02-12 – 2019-02-15 (×33): 15 mL via OROMUCOSAL

## 2019-02-12 MED ORDER — DIVALPROEX SODIUM 125 MG PO CSDR
500.0000 mg | DELAYED_RELEASE_CAPSULE | Freq: Three times a day (TID) | ORAL | Status: DC
  Administered 2019-02-12 – 2019-02-13 (×3): 500 mg via ORAL
  Filled 2019-02-12 (×5): qty 4

## 2019-02-12 MED ORDER — FENTANYL BOLUS VIA INFUSION
50.0000 ug | INTRAVENOUS | Status: DC | PRN
  Administered 2019-02-12 (×2): 50 ug via INTRAVENOUS
  Filled 2019-02-12: qty 50

## 2019-02-12 MED ORDER — SODIUM CHLORIDE 0.9 % IV SOLN
250.0000 mL | INTRAVENOUS | Status: DC
  Administered 2019-02-12 – 2019-02-15 (×2): 250 mL via INTRAVENOUS

## 2019-02-12 MED ORDER — SODIUM BICARBONATE 8.4 % IV SOLN
INTRAVENOUS | Status: AC | PRN
  Administered 2019-02-12: 50 meq via INTRAVENOUS

## 2019-02-12 MED ORDER — CHLORHEXIDINE GLUCONATE CLOTH 2 % EX PADS
6.0000 | MEDICATED_PAD | Freq: Every day | CUTANEOUS | Status: DC
  Administered 2019-02-12 – 2019-02-14 (×3): 6 via TOPICAL

## 2019-02-12 MED ORDER — MIDAZOLAM BOLUS VIA INFUSION
1.0000 mg | INTRAVENOUS | Status: DC | PRN
  Administered 2019-02-12 – 2019-02-13 (×2): 2 mg via INTRAVENOUS
  Filled 2019-02-12: qty 2

## 2019-02-12 MED ORDER — MIDAZOLAM HCL 2 MG/2ML IJ SOLN
INTRAMUSCULAR | Status: AC
  Filled 2019-02-12: qty 2

## 2019-02-12 MED ORDER — PIPERACILLIN-TAZOBACTAM 3.375 G IVPB 30 MIN
3.3750 g | Freq: Once | INTRAVENOUS | Status: AC
  Administered 2019-02-12: 3.375 g via INTRAVENOUS
  Filled 2019-02-12 (×2): qty 50

## 2019-02-12 MED ORDER — AMIODARONE LOAD VIA INFUSION
150.0000 mg | Freq: Once | INTRAVENOUS | Status: AC
  Administered 2019-02-12: 150 mg via INTRAVENOUS
  Filled 2019-02-12: qty 83.34

## 2019-02-12 MED ORDER — CALCIUM GLUCONATE-NACL 1-0.675 GM/50ML-% IV SOLN
1.0000 g | Freq: Once | INTRAVENOUS | Status: AC
  Administered 2019-02-12: 1000 mg via INTRAVENOUS
  Filled 2019-02-12: qty 50

## 2019-02-12 MED ORDER — MIDAZOLAM 50MG/50ML (1MG/ML) PREMIX INFUSION
0.0000 mg/h | INTRAVENOUS | Status: DC
  Administered 2019-02-12 (×2): 8 mg/h via INTRAVENOUS
  Administered 2019-02-12: 10 mg/h via INTRAVENOUS
  Administered 2019-02-12: 2 mg/h via INTRAVENOUS
  Administered 2019-02-12: 10 mg/h via INTRAVENOUS
  Administered 2019-02-13: 2 mg/h via INTRAVENOUS
  Administered 2019-02-13 (×2): 9 mg/h via INTRAVENOUS
  Administered 2019-02-15: 2 mg/h via INTRAVENOUS
  Filled 2019-02-12 (×9): qty 50

## 2019-02-12 MED ORDER — FENTANYL CITRATE (PF) 100 MCG/2ML IJ SOLN: 50.0000 ug | Freq: Once | INTRAMUSCULAR | Status: DC

## 2019-02-12 MED ORDER — MIDAZOLAM HCL 2 MG/2ML IJ SOLN: 2.0000 mg | INTRAMUSCULAR | Status: DC | PRN

## 2019-02-12 MED FILL — Medication: Qty: 1 | Status: AC

## 2019-02-12 NOTE — Progress Notes (Signed)
  Echocardiogram 2D Echocardiogram has been performed.  Charles Martin 02/12/2019, 10:00 AM

## 2019-02-12 NOTE — ED Notes (Signed)
Pt cardioverted at 150j

## 2019-02-12 NOTE — Progress Notes (Signed)
Subjective: Continued myoclonus.   Exam: Vitals:   02/12/19 0600 02/12/19 0700  BP: 122/75 128/82  Pulse: 74 73  Resp: (!) 21 (!) 22  Temp:    SpO2: 100% 100%   Gen: In bed, intubated Resp: ventilated Abd: soft, nt  Neuro: MS: Eyes open only with myoclonus.  CN: PERRL, no corneal, no doll's Motor: no response to nox stim.  Sensory:as above.   Pertinent Labs: UDS + for cocaine, benzos, THC   Impression: 53 yo M with status myoclonus s/p cardiac arrest. With burst suppression on EEG coupled with clinical movements, this is highly correlated with poor progonsis. Propofol can be used to control his movements, but if they continue to be refractory, treatment is unlikely to improve outcome.   Recommendations: 1) Additional keppra, could add depakote if no response 2) propofol to control movments,  3) Reassess EEG 4) will follow.   This patient is critically ill and at significant risk of neurological worsening, death and care requires constant monitoring of vital signs, hemodynamics,respiratory and cardiac monitoring, neurological assessment, discussion with family, other specialists and medical decision making of high complexity. I spent 30 minutes of neurocritical care time  in the care of  this patient. This was time spent independent of any time provided by nurse practitioner or PA.  Roland Rack, MD [facility] [phone]  If 7pm- 7am, please page neurology on call as listed in [place]. 02/12/2019  8:41 AM

## 2019-02-12 NOTE — Progress Notes (Signed)
Patient transported to and from CT without complications.  

## 2019-02-12 NOTE — ED Notes (Signed)
Pt cardioverted at 120j
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
## 2019-02-12 NOTE — Procedures (Addendum)
Patient Name: Charles Martin.  MRN: [id number]  Epilepsy Attending: Lora Havens  Referring Physician/Provider: Dr. Karena Addison Aroor Duration: 02/12/2019 0404 to 02/03/2019 0404  Patient history: 53 year old male status post cardiac arrest and seizure-like episodes.  EEG to evaluate for seizures.  Level of alertness: Comatose  AEDs during EEG study: Versed, Propofol, Keppra, VPA  Technical aspects: This EEG study was done with scalp electrodes positioned according to the 10-20 International system of electrode placement. Electrical activity was acquired at a sampling rate of 500Hz  and reviewed with a high frequency filter of 70Hz  and a low frequency filter of 1Hz . EEG data were recorded continuously and digitally stored.   Description: EEG initially showed continuous generalized background suppression for 4 to 5 seconds with frequent intermittent bursts of generalized polyspikes.  Clinically during generalized polyspikes patient was noted to have axial whole-body jerking suggestive of myoclonus.  Eventually, EEG continued to show generalized periodic epileptiform discharges at 3-4hz , alternating with brief 0.5 to 1 second of EEG suppression. Hyperventilation and photic stimulation were not performed.  Abnormality -Status myoclonus -Generalized periodic epileptiform discharges -Burst suppression  IMPRESSION: This study initially showed status myoclonus.  Eventually, EEG continued to show generalized periodic epileptiform discharges without any clinical correlate.  Additionally there is evidence of profound diffuse encephalopathy, nonspecific to etiology but likely secondary to hypoxic/anoxic brain injury in setting of cardiac arrest.

## 2019-02-12 NOTE — Progress Notes (Addendum)
PCCM:  Discussed current prognosis and recent lab results to include urine drug screen positivity for cocaine with patient's mother at bedside.  Patient has 2 children however has been estranged to them for many years.  The mother is going to reach out to his father, his sister as well as children as they are making decisions and she is trying to help coordinate this collectively.  However the children have wished for her to make these decisions.  She was thankful for the update.  Garner Nash, DO [facility] 02/12/2019 3:21 PM     18 mins spent with advance care planning discussing code status. Decision was made to make patient a DNR. We will continue further discussions as days progress.   [facility] 02/12/2019 4:31 PM

## 2019-02-12 NOTE — ED Notes (Signed)
To CT at this time.

## 2019-02-12 NOTE — ED Notes (Signed)
Levo increased to 5mcg/min

## 2019-02-12 NOTE — Progress Notes (Signed)
Pharmacy Antibiotic Note  Charles Martin. is a 53 y.o. male admitted on 02/02/2019 with concern for intra-abdominal infection.  Pharmacy has been consulted for Zosyn dosing.  Plan: Zosyn 3.375g IV q8h (4-hour infusion).  Height: 6' (182.9 cm) IBW/kg (Calculated) : 77.6  Temp (24hrs), Avg:96.8 F (36 C), Min:95.5 F (35.3 C), Max:98 F (36.7 C)  Recent Labs  Lab 01/25/2019 2342 02/12/19 0019 02/12/19 0239  WBC 8.4  --   --   CREATININE 1.56* 1.50*  --   LATICACIDVEN 6.7*  --  3.4*     No Known Allergies   Thank you for allowing pharmacy to be a part of this patient's care.  Wynona Neat, PharmD, BCPS  02/12/2019 4:40 AM

## 2019-02-12 NOTE — Progress Notes (Signed)
Chaplain engaged in initial visit with Charles Martin mom.  Mom shared her love for her son to chaplain.  Mom talked about trying not to fall apart and chaplain worked to offer a safe space for grief.  Chaplain offered spiritual support and presence.  Mom told chaplain to come back and continue to visit.  Chaplain will follow-up.

## 2019-02-12 NOTE — ED Notes (Signed)
Levo gtt up to 10 mcg/min

## 2019-02-12 NOTE — ED Notes (Signed)
Return from CT

## 2019-02-12 NOTE — Consult Note (Signed)
Responded to ED call for CPR pt with family in consult room: mother, stepfather, girlfriend. Provided spiritual/ emotional support and prayer, which they appreciated. Family is [place]. Returned when doctor came to brief family-- another adult family member had joined them. They understand pt will be in ICU after tests, and they will not all be able to visit. Gave mother and girlfriend's contact #'s to registration desk. Pt had been at [facility] previously, and the record showed only mother as contact then. Family is grieving, and will need support. They were undecided how long to wait here tonight, as the doctor will call them with any news and they understand visitors to the ICU will be limited.  Rev. Eloise Levels Chaplain

## 2019-02-12 NOTE — Progress Notes (Signed)
maint complete. When tech went into pt's room the EEG machine was off - it was plugged in but unsure what happened.. EEG came back up to record but there was only 43 minutes at that time. Since then I've checked the EEG and it appears to be running fine now.

## 2019-02-12 NOTE — Progress Notes (Addendum)
PCCM:  Brief pulmonary critical care follow-up note  53 year old gentleman status post cardiac arrest intubated on mechanical life support.  Patient with ongoing myoclonus.  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  Long-term EEG consistent with hypoxic brain injury.  Neurology agrees poor prognosis.  Uptitrated propofol to help control movements continue Keppra.  UDS positive for cocaine, benzos, THC  We will update family with results. Overall poor prognosis due to myoclonus.   "indistict" fluid collection/edema in the abdomen on repeat CT  I will discuss with general surgery  Follow up H&H   Please see full H&P for complete documentation signed earlier this morning.  This patient is critically ill with multiple organ system failure; which, requires frequent high complexity decision making, assessment, support, evaluation, and titration of therapies. This was completed through the application of advanced monitoring technologies and extensive interpretation of multiple databases. During this encounter critical care time was devoted to patient care services described in this note for 32 minutes.  Garner Nash, DO [facility] 02/12/2019 11:01 AM

## 2019-02-12 NOTE — ED Notes (Signed)
Condom cath placed on pt 
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
## 2019-02-12 NOTE — Progress Notes (Signed)
LTM EEG initiated, staff educated regarding pt event button. Dr. Lorraine Lax and Dr Hortense Ramal notified.

## 2019-02-12 NOTE — Progress Notes (Signed)
Patient transported to and from CT with no complications 

## 2019-02-12 NOTE — Progress Notes (Addendum)
Pt has been off Fentanyl infusion since previous shift however still in line. Approximately 151ml of Fentanyl wasted in stericycle. Witnessed by Drucie Opitz., RN

## 2019-02-12 NOTE — ED Notes (Signed)
Levo gtt down to 7.5 mcg/min

## 2019-02-13 ENCOUNTER — Inpatient Hospital Stay (HOSPITAL_COMMUNITY): Payer: Medicare Other

## 2019-02-13 DIAGNOSIS — I469 Cardiac arrest, cause unspecified: Secondary | ICD-10-CM

## 2019-02-13 DIAGNOSIS — Z515 Encounter for palliative care: Secondary | ICD-10-CM

## 2019-02-13 DIAGNOSIS — J9601 Acute respiratory failure with hypoxia: Secondary | ICD-10-CM | POA: Diagnosis not present

## 2019-02-13 DIAGNOSIS — G253 Myoclonus: Secondary | ICD-10-CM

## 2019-02-13 DIAGNOSIS — G931 Anoxic brain damage, not elsewhere classified: Secondary | ICD-10-CM

## 2019-02-13 DIAGNOSIS — Z9911 Dependence on respirator [ventilator] status: Secondary | ICD-10-CM | POA: Diagnosis not present

## 2019-02-13 DIAGNOSIS — F1490 Cocaine use, unspecified, uncomplicated: Secondary | ICD-10-CM

## 2019-02-13 LAB — POCT I-STAT 7, (LYTES, BLD GAS, ICA,H+H)
Acid-Base Excess: 6 mmol/L — ABNORMAL HIGH (ref 0.0–2.0)
Bicarbonate: 27.3 mmol/L (ref 20.0–28.0)
Calcium, Ion: 1.03 mmol/L — ABNORMAL LOW (ref 1.15–1.40)
HCT: 27 % — ABNORMAL LOW (ref 39.0–52.0)
Hemoglobin: 9.2 g/dL — ABNORMAL LOW (ref 13.0–17.0)
O2 Saturation: 93 %
Patient temperature: 37
Potassium: 2.7 mmol/L — CL (ref 3.5–5.1)
Sodium: 140 mmol/L (ref 135–145)
TCO2: 28 mmol/L (ref 22–32)
pCO2 arterial: 28.4 mmHg — ABNORMAL LOW (ref 32.0–48.0)
pH, Arterial: 7.591 — ABNORMAL HIGH (ref 7.350–7.450)
pO2, Arterial: 55 mmHg — ABNORMAL LOW (ref 83.0–108.0)

## 2019-02-13 LAB — BASIC METABOLIC PANEL
Anion gap: 13 (ref 5–15)
BUN: 11 mg/dL (ref 6–20)
CO2: 26 mmol/L (ref 22–32)
Calcium: 7.7 mg/dL — ABNORMAL LOW (ref 8.9–10.3)
Chloride: 102 mmol/L (ref 98–111)
Creatinine, Ser: 1.22 mg/dL (ref 0.61–1.24)
GFR calc Af Amer: >60 mL/min (ref >60)
GFR calc non Af Amer: >60 mL/min (ref >60)
Glucose, Bld: 93 mg/dL (ref 70–99)
Potassium: 2.7 mmol/L — CL (ref 3.5–5.1)
Sodium: 141 mmol/L (ref 135–145)

## 2019-02-13 LAB — CBC
HCT: 28.5 % — ABNORMAL LOW (ref 39.0–52.0)
Hemoglobin: 9.5 g/dL — ABNORMAL LOW (ref 13.0–17.0)
MCH: 30.8 pg (ref 26.0–34.0)
MCHC: 33.3 g/dL (ref 30.0–36.0)
MCV: 92.5 fL (ref 80.0–100.0)
Platelets: 139 10*3/uL — ABNORMAL LOW (ref 150–400)
RBC: 3.08 MIL/uL — ABNORMAL LOW (ref 4.22–5.81)
RDW: 13.7 % (ref 11.5–15.5)
WBC: 7.5 10*3/uL (ref 4.0–10.5)
nRBC: 0 % (ref 0.0–0.2)

## 2019-02-13 LAB — GLUCOSE, CAPILLARY
Glucose-Capillary: 87 mg/dL (ref 70–99)
Glucose-Capillary: 90 mg/dL (ref 70–99)
Glucose-Capillary: 95 mg/dL (ref 70–99)
Glucose-Capillary: 89 mg/dL (ref 70–99)
Glucose-Capillary: 97 mg/dL (ref 70–99)

## 2019-02-13 LAB — LACTIC ACID, PLASMA: Lactic Acid, Venous: 1.4 mmol/L (ref 0.5–1.9)

## 2019-02-13 MED ORDER — AMIODARONE HCL 200 MG PO TABS
200.0000 mg | ORAL_TABLET | Freq: Two times a day (BID) | ORAL | Status: DC
  Administered 2019-02-13 – 2019-02-15 (×5): 200 mg
  Filled 2019-02-13 (×5): qty 1

## 2019-02-13 MED ORDER — NOREPINEPHRINE 4 MG/250ML-% IV SOLN
0.0000 ug/min | INTRAVENOUS | Status: DC
  Filled 2019-02-13: qty 250

## 2019-02-13 MED ORDER — POTASSIUM CHLORIDE 20 MEQ/15ML (10%) PO SOLN
60.0000 meq | Freq: Four times a day (QID) | ORAL | Status: AC
  Administered 2019-02-13 (×2): 60 meq via ORAL
  Filled 2019-02-13 (×2): qty 45

## 2019-02-13 MED ORDER — HYPROMELLOSE (GONIOSCOPIC) 2.5 % OP SOLN
1.0000 [drp] | Freq: Every day | OPHTHALMIC | Status: DC
  Administered 2019-02-13 – 2019-02-15 (×12): 1 [drp] via OPHTHALMIC
  Filled 2019-02-13: qty 15

## 2019-02-13 MED ORDER — VALPROIC ACID 250 MG/5ML PO SOLN
500.0000 mg | Freq: Three times a day (TID) | ORAL | Status: DC
  Administered 2019-02-13 – 2019-02-15 (×6): 500 mg via ORAL
  Filled 2019-02-13 (×10): qty 10

## 2019-02-13 NOTE — Procedures (Signed)
Patient Name: Charles Martin.  MRN: [id number]  Epilepsy Attending: Lora Havens  Referring Physician/Provider: Dr. Karena Addison Aroor Duration: 02/13/2019 0404 to 0907  Patient history: 53 year old male status post cardiac arrest and seizure-like episodes.  EEG to evaluate for seizures.  Level of alertness: Comatose  AEDs during EEG study: Versed, Propofol, Keppra, VPA  Technical aspects: This EEG study was done with scalp electrodes positioned according to the 10-20 International system of electrode placement. Electrical activity was acquired at a sampling rate of 500Hz  and reviewed with a high frequency filter of 70Hz  and a low frequency filter of 1Hz . EEG data were recorded continuously and digitally stored.   Description: EEG initially showed continuous generalized background suppression for 4 to 5 seconds with frequent intermittent bursts of generalized polyspikes.  Clinically during generalized polyspikes patient was noted to have axial whole-body jerking suggestive of myoclonus.  Eventually, EEG continued to show generalized periodic epileptiform discharges at 3-4hz , alternating with brief 0.5 to 1 second of EEG suppression. Hyperventilation and photic stimulation were not performed.  Abnormality -Status myoclonus -Generalized periodic epileptiform discharges -Burst suppression  IMPRESSION: This study initially showed status myoclonus.  Eventually, EEG continued to show generalized periodic epileptiform discharges without any clinical correlate.  Additionally there is evidence of profound diffuse encephalopathy, nonspecific to etiology but likely secondary to hypoxic/anoxic brain injury in setting of cardiac arrest.

## 2019-02-13 NOTE — Progress Notes (Signed)
Critical ABG shown to RN.

## 2019-02-13 NOTE — Progress Notes (Signed)
LTM D/Ced. Removed electrodes, noted bil indentions on forehead, no skin break down. Notified RN to watch.

## 2019-02-13 NOTE — Progress Notes (Signed)
Chaplain spoke with Charles Martin mom.  She shared stories with "Charles Martin" to chaplain.  Chaplain offered spiritual presence and support.  Mother received call and asked for chaplain to come back and pray with her later.  Chaplain will follow-up.

## 2019-02-13 NOTE — Progress Notes (Signed)
Initial Nutrition Assessment  DOCUMENTATION CODES:   Obesity unspecified  INTERVENTION:  If unable to extubate, Recommend Vital High Protein at goal rate of 20 ml/h (480 ml per day) and Prostat 60 ml QID.  Tube feeding regimen with current propofol rate to provide 2228 kcals, 162 gm protein, 403 ml free water daily.  NUTRITION DIAGNOSIS:   Inadequate oral intake related to inability to eat as evidenced by NPO status.  GOAL:   Provide needs based on ASPEN/SCCM guidelines  MONITOR:   Vent status, Skin, Weight trends, Labs, I & O's  REASON FOR ASSESSMENT:   Ventilator    ASSESSMENT:   53 year old male with history of tobacco abuse, GERD presenting from home with cardiac arrest. Unclear down time without CPR, likely prolonged, prior to EMS finding in [facility], ROSC obtained after 13 mins. Pt DNR. CT chest w/Bilateral rib and costal cartilage fractures.   Patient is currently intubated on ventilator support MV: 14.4 L/min Temp (24hrs), Avg:96.2 F (35.7 C), Min:93.6 F (34.2 C), Max:97.9 F (36.6 C)  Propofol: 35.9 ml/hr which provides 948 kcal/day.  Pt underwent EEG today with evidence of profound diffuse encephalopathy,nonspecific to etiology but likely secondary to hypoxic/anoxic brain injury in setting of cardiac arrest. Palliative care has been consulted. If pt remains intubated for >/= 24-48 hours, recommend enteral nutrition. NG in place to LIS with 300 ml output.  NUTRITION - FOCUSED PHYSICAL EXAM:    Most Recent Value  Orbital Region  No depletion  Upper Arm Region  No depletion  Thoracic and Lumbar Region  No depletion  Buccal Region  No depletion  Temple Region  No depletion  Clavicle Bone Region  No depletion  Clavicle and Acromion Bone Region  No depletion  Scapular Bone Region  No depletion  Dorsal Hand  No depletion  Patellar Region  No depletion  Anterior Thigh Region  No depletion  Posterior Calf Region  No depletion  Edema (RD Assessment)  Mild   Hair  Reviewed  Eyes  Unable to assess  Mouth  Unable to assess  Skin  Reviewed  Nails  Unable to assess     Labs and medications reviewed.   Diet Order:   Diet Order            Diet NPO time specified  Diet effective now              EDUCATION NEEDS:   Not appropriate for education at this time  Skin:  Skin Assessment: Reviewed RN Assessment  Last BM:  Unknown  Height:   Ht Readings from Last 1 Encounters:  02/12/19 6' (1.829 m)    Weight:   Wt Readings from Last 1 Encounters:  02/13/19 108.7 kg    Ideal Body Weight:  80.9 kg  BMI:  Body mass index is 32.5 kg/m.  Estimated Nutritional Needs:   Kcal:  [id number]  Protein:  160-170 grams  Fluid:  >/= 2 L/day    Corrin Parker, MS, RD, LDN Pager # [phone] After hours/ weekend pager # [phone]

## 2019-02-13 NOTE — Progress Notes (Signed)
eLink Physician-Brief Progress Note Patient Name: Charles Martin. DOB: Nov 02, 1965 MRN: [id number]   Date of Service  02/13/2019  HPI/Events of Note  I was informed patient under CDS and requesting arterial line for blood draws  eICU Interventions  Arterial line insertion ordered     Intervention Category Minor Interventions: Other:  Judd Lien 02/13/2019, 8:52 PM

## 2019-02-13 NOTE — Progress Notes (Signed)
eLink Physician-Brief Progress Note Patient Name: Charles Martin. DOB: 03-Dec-1965 MRN: [id number]   Date of Service  02/13/2019  HPI/Events of Note  Hypothermia  eICU Interventions  Warming blanket ordered        Frederik Pear 02/13/2019, 5:01 AM

## 2019-02-13 NOTE — Consult Note (Signed)
Palliative Care Consult Note  53 yo man s/p cardiac arrest and subsequent anoxic brain injury, palliative care consult requested for compassionate removal of mechanical ventilation.  I met with the patient's mother at bedside. Family agree that if patient cannot make a full recovery that he would not want his life prolonged in these circumstances. His mother is dealing with grief and anger at the circumstances of his cardiac arrest. I provided empathetic listening and support.  The do want to proceed with compassionate wean and comfort care but the major barrier is related to family visitation. His sister is an Therapist, sports and is flying back into town tomorrow AM- per Covid guidelines she will have to be tested prior to visiting. His dad also has a strong desire to see him. His mom is exhausted and is really struggling as she goes through this at his bedside alone.  At this this time his mother cannot commit to a time frame for extubation and comfort care transition until his sister gets here and can help with decision making and process.   I support flexibility in terms of allowing family to visit if this will allow [place] to advance the care of the patient towards comfort care and provide relief to his mother who has been the only one allowed to be at the bedside. I have discussed this with nursing staff.  His mother also brought up the question of organ donation. I told her I would have CDS speak with her about these options. I was contacted by Dell Ponto, Family Support Coordinator with CDS following my visit and updated her on the current care plan and also told her that family had requested information.   I will follow for support and needs -until family can see him and his sister gets back into town we will continue current levels of support. He is DNR and I also discussed the possibility that he could died on the vent  Even with continued interventions.  Lane Hacker, DO Palliative  Medicine [phone]

## 2019-02-13 NOTE — Progress Notes (Addendum)
NAME:  Charles Martin., MRN:  [id number], DOB:  1965-04-20, LOS: 1 ADMISSION DATE:  01/30/2019, CONSULTATION DATE:  02/12/2019 REFERRING MD:  Dr. Leonette Monarch, CHIEF COMPLAINT:  Cardiac arrest  Brief History   53 year old male presenting from home with cardiac arrest occurring during sexual intercourse.  Unclear down time without CPR, likely prolonged, prior to EMS finding in [facility], ROSC obtained after 13 mins.  Developed Afib w/RVR with ongoing hypotension in ER requiring vasopressor support.  Has remained encephalopathic not requiring sedation with jerking movements.  CTH neg.  CT abd/pelvis concerning duodenal injury.  PCCM called for admit.   History of present illness   53 year old male with history of tobacco abuse, GERD, and erectile dysfunction presenting from home with cardiac arrest.  Reportedly cardiac arrest occurred during sexual intercourse.  Patient had taken sildenafil prior to.  Also noted that a crack pipe and white powder was found with patient belongins.  Girlfriend reported patient had not been feeling well for several weeks but no infectious symptoms reported.  CPR was not started until EMS arrival.  Unclear down time patient was down without CPR as girlfriend was unable to move from underneath him. Found in [facility] with EMS.  ROSC obtained after 9 mins and two EPI.  On arrival to ER, patient remained unresponsive and hypotensive.  Temp 95.5.  4 L NS given.  EKG noted with diffuse ST segment depression.  Was given epi for severe hypotension episode when patient had brief spontaneously resolving Vtach. Was placed on levophed for hypotension.  Went into Afib with RVR requiring synchronized cardioversion which was unsuccessful.  He was started on amiodarone gtt.  Labs noted for glucose 220, sCr 1.56, AG 19, AST 42, hs-trop 6, lactic acid 6.7, normal coags, EKG in ER without acute ST elevation. CXR noted for satisfactory placement with likely atelectasis.  CT chest showing possible pulmonary  contusion with multiple rib and cartilage fractures.  CT abd/ pelvis concerning for duodenal injury with recs for repeated CT scan.  UA, UDS and COVID pending.  PCCM called for admit.   Past Medical History  Tobacco abuse, GERD, ED, diverticulosis  Significant Hospital Events   11/26 Admit [facility]  Consults:  Neurology   Procedures:  11/26 ETT >>  Significant Diagnostic Tests:  11/26 [facility] >> no acute intracranial abnormality   11/26 CTA chest/ abd/ pelvis >> 1. No acute aortic abnormality. 2. Large volume of intermediate to high attenuation material mostly surrounding the first and second portions of the duodenum. No sites of active contrast extravasation or free air is seen however.  Findings could reflect a duodenal perforation or injury either primary or secondary to resuscitative efforts versus a perforated duodenal ulcer. Potential options for further assessment could include a repeat, noncontrast CT of the abdomen to assess for accumulation of the intravenous contrast administered on this exam. 3. Bilateral rib and costal cartilage fractures as detailed above.  No pneumothorax. Patchy airspace disease in the anterior lungs may reflect associated pulmonary contusion. 4. Dependent airspace opacity likely reflecting further atelectatic change and volume loss in the setting of resuscitation. 5. Cardiomegaly and coronary artery atherosclerosis. 6. Colonic diverticulosis without evidence of diverticulitis. 7. Endotracheal tube terminates 3.6 cm from the carina. 8. Transesophageal tube tip and side port distal to the GE junction. 9. Aortic Atherosclerosis 10. Minimal atheromatous narrowing of the IMA origin.  Micro Data:  11/26 SARS CoV2 >> neg  Antimicrobials:  02/12/2019 Zosyn>> Interim history/subjective:  Poor neurological  indicators, currently DNR.  Objective   Blood pressure (!) 89/57, pulse 73, temperature (!) 96.3 F (35.7 C), resp. rate (!) 25, height 6' (1.829 m), weight  108.7 kg, SpO2 97 %.    Vent Mode: PRVC FiO2 (%):  [30 %-40 %] 30 % Set Rate:  [25 bmp] 25 bmp Vt Set:  [620 mL] 620 mL PEEP:  [5 cmH20] 5 cmH20 Plateau Pressure:  [18 cmH20-20 cmH20] 19 cmH20   Intake/Output Summary (Last 24 hours) at 02/13/2019 0851 Last data filed at 02/13/2019 0800 Gross per 24 hour  Intake 7042.3 ml  Output 4240 ml  Net 2802.3 ml   Filed Weights   02/12/19 0800 02/13/19 0300  Weight: 99.8 kg 108.7 kg    Examination: General: Middle-aged male with no seizure activity currently HEENT: Endotracheal tube is in place Neuro: Negative doll's eyes.  Positive gag cough reflex.  Currently sedated with propofol Versed CV: Heart sounds are irregular PULM: Decreased breath sounds at bases coarse rhonchi Vent PRVC mode FIO2 30% PEEP 5 RATE 26 VT 620 ABGs pending  GI: Soft diminished bowel sounds GU: Foley with amber urine Extremities: warm/dry,  edema  Skin: no rashes or lesions   Resolved Hospital Problem list    Assessment & Plan:   Cardiac arrest Shock- unclear  P:  Vasopressors as needed Trend troponins Recheck lactic acid   Acute respiratory failure  Question of possible pulmonary contusion on CT chest w/Bilateral rib and costal cartilage fractures P:  Vent bundle Check ABG    New onset Afib with RVR P:  Currently on amiodarone No anticoagulation at this time    AKI AGMA Lab Results  Component Value Date   CREATININE 1.10 02/12/2019   CREATININE 1.50 (H) 02/12/2019   CREATININE 1.56 (H) 01/26/2019   Recent Labs  Lab 02/12/19 0043 02/12/19 0447 02/12/19 0457  K 3.0* 4.2 4.1    P:  Strict intake and output Currently on bicarb drip will recheck lactic acid  Tonic-clonic seizure activity Polysubstance abuse Extended downtime  P:  Neurology consult appreciated Keppra Propofol Monitor metabolic derangements Poor prognosis currently DO NOT RESUSCITATE   R/o duodenal injury/ rupture  Recent Labs     02/12/19 2030 02/13/19 0450  HGB 10.4* 9.5*    P:  Repeat CT of abdomen and pelvis with contrast is nonconclusive. Proton pump inhibitor Serial CBC Empirical Zosyn started on 02/12/2019  Hyperglycemia CBG (last 3)  Recent Labs    02/12/19 2352 02/13/19 0352 02/13/19 0742  GLUCAP 86 87 90    P:  Sliding scale insulin protocol    Best practice:  Diet: NPO Pain/Anxiety/Delirium protocol (if indicated): Currently on propofol drip VAP protocol (if indicated): yes DVT prophylaxis: SCDs, heparin SQ GI prophylaxis: PPI BID Glucose control: CBG q 4, add SSI if > 180 Mobility: BR Code Status: DO NOT RESUSCITATE Family Communication: Mother and father updated at bedside by Dr. Randell Patient Disposition: ICU  Labs   CBC: Recent Labs  Lab 01/27/2019 2342  02/12/19 0447 02/12/19 0457 02/12/19 1250 02/12/19 2030 02/13/19 0450  WBC 8.4  --  11.8*  --  8.8 7.5 7.5  HGB 13.9   < > 11.9* 11.2* 10.7* 10.4* 9.5*  HCT 43.7   < > 36.1* 33.0* 31.3* 30.7* 28.5*  MCV 96.5  --  93.3  --  91.0 91.6 92.5  PLT 256  --  214  --  187 161 139*   < > = values in this interval not displayed.  Basic Metabolic Panel: Recent Labs  Lab 01/23/2019 2342 02/12/19 0019 02/12/19 0043 02/12/19 0447 02/12/19 0457  NA 137 137 139 139 140  K 4.0 3.8 3.0* 4.2 4.1  CL 103 104  --  108  --   CO2 15*  --   --  21*  --   GLUCOSE 220* 215*  --  124*  --   BUN 16 18  --  14  --   CREATININE 1.56* 1.50*  --  1.10  --   CALCIUM 8.8*  --   --  7.4*  --   MG 2.5*  --   --  1.9  --   PHOS  --   --   --  3.2  --    GFR: Estimated Creatinine Clearance: 98.9 mL/min (by C-G formula based on SCr of 1.1 mg/dL). Recent Labs  Lab 02/14/2019 2342 02/12/19 0239 02/12/19 0409 02/12/19 0447 02/12/19 1250 02/12/19 2030 02/13/19 0450  WBC 8.4  --   --  11.8* 8.8 7.5 7.5  LATICACIDVEN 6.7* 3.4* 2.9*  --   --   --   --     Liver Function Tests: Recent Labs  Lab 02/14/2019 2342  AST 42*  ALT 34  ALKPHOS 66   BILITOT 0.4  PROT 6.8  ALBUMIN 4.1   No results for input(s): LIPASE, AMYLASE in the last 168 hours. No results for input(s): AMMONIA in the last 168 hours.  ABG    Component Value Date/Time   PHART 7.387 02/12/2019 0457   PCO2ART 34.4 02/12/2019 0457   PO2ART 103.0 02/12/2019 0457   HCO3 20.8 02/12/2019 0457   TCO2 22 02/12/2019 0457   ACIDBASEDEF 4.0 (H) 02/12/2019 0457   O2SAT 98.0 02/12/2019 0457     Coagulation Profile: Recent Labs  Lab 01/28/2019 2342  INR 1.0    Cardiac Enzymes: No results for input(s): CKTOTAL, CKMB, CKMBINDEX, TROPONINI in the last 168 hours.  HbA1C: Hemoglobin A1C  Date/Time Value Ref Range Status  07/28/2013 5.5  Final   Hgb A1c MFr Bld  Date/Time Value Ref Range Status  02/12/2019 04:47 AM 5.6 4.8 - 5.6 % Final    Comment:    (NOTE) Pre diabetes:          5.7%-6.4% Diabetes:              >6.4% Glycemic control for   <7.0% adults with diabetes   04/15/2017 09:12 AM 5.5 4.8 - 5.6 % Final    Comment:             Prediabetes: 5.7 - 6.4          Diabetes: >6.4          Glycemic control for adults with diabetes: <7.0     CBG: Recent Labs  Lab 02/12/19 1516 02/12/19 2044 02/12/19 2352 02/13/19 0352 02/13/19 0742  GLUCAP 86 84 86 87 90     Critical care time:  40 mins     Richardson Landry Minor ACNP Acute Care Nurse Practitioner [place] Please consult Amion 02/13/2019, 8:51 AM    Pulmonary critical care attending:  53 year old gentleman admitted status post cardiac arrest likely secondary to vasospasm and ischemia while using cocaine.  Developed A. fib RVR following and hypotension shock.  Unfortunately likely profound brain injury, likely anoxic brain injury due to low flow state from resuscitation and cardiac arrest.  Now with myoclonic jerking and flat baseline EEG.  BP (!) 96/57 (BP Location: Right Arm)  Pulse 74    Temp 97.9 F (36.6 C) (Esophageal)    Resp (!) 25    Ht 6' (1.829 m)    Wt 108.7  kg    SpO2 97%    BMI 32.50 kg/m   General: Middle-aged male, intubated mechanical life support HEENT: Endotracheal tube in place, Neck: Trachea midline Cardiac: Regular rhythm S1-S2 Lungs: Bilateral ventilated breath sounds Neuro: Unresponsive to pain, occasional twitching motions off sedation.  Labs reviewed Imaging reviewed EEG results with flat background, poor prognosis  Assessment: Anoxic brain injury Cardiac arrest, presumed ischemia/coronary vasospasm in the setting of cocaine use.  There is evidence to support that this is related to substance abuse and cocaine. Acute hypoxemic respiratory failure requiring intubation and mechanical ventilation following cardiac arrest secondary to above New onset A. fib RVR Acute renal failure secondary to shock state following cardiac arrest Acute anion gap metabolic acidosis secondary to lactic acidosis, resolved  Plan: Ongoing goals of care discussion with family. Continued use of propofol for myoclonic jerking Continue Keppra per neurology Agree with ongoing DNR Family coming in to the hospital. Consult placed to palliative care for their assistance. We appreciate their input.  Called and discussed care with patient's mother via phone this morning.  This patient is critically ill with multiple organ system failure; which, requires frequent high complexity decision making, assessment, support, evaluation, and titration of therapies. This was completed through the application of advanced monitoring technologies and extensive interpretation of multiple databases. During this encounter critical care time was devoted to patient care services described in this note for 32 minutes.   Garner Nash, DO [facility] 02/13/2019 2:51 PM

## 2019-02-13 NOTE — Procedures (Signed)
Arterial Catheter Insertion Procedure Note Tashi Brkic [id number] [birth year]  Procedure: Insertion of Arterial Catheter  Indications: Blood pressure monitoring  Procedure Details Consent: Unable to obtain consent because of altered level of consciousness. Time Out: Verified patient identification, verified procedure, site/side was marked, verified correct patient position, special equipment/implants available, medications/allergies/relevent history reviewed, required imaging and test results available.  Performed  Maximum sterile technique was used including antiseptics, cap, gloves, gown, hand hygiene, mask and sheet. Skin prep: Chlorhexidine; local anesthetic administered 20 gauge catheter was inserted into right radial artery using the Seldinger technique. ULTRASOUND GUIDANCE USED: NO Evaluation Blood flow good; BP tracing good. Complications: No apparent complications.   Romeo Apple 02/13/2019

## 2019-02-13 NOTE — Progress Notes (Signed)
Subjective: Movements controlled, continued GPEDs on a flat background on EEG.   Exam: Vitals:   02/13/19 0605 02/13/19 0700  BP: (!) 91/57 (!) 89/57  Pulse: 63 68  Resp: (!) 25 (!) 25  Temp: (!) 94.6 F (34.8 C) (!) 96.3 F (35.7 C)  SpO2: 96% 95%   Gen: In bed, intubated Resp: ventilated Abd: soft, nt  Neuro: MS: Eyes open only with myoclonus.  CN: PERRL, no corneal, doll's is present on leftward head turn.  Motor: no response to nox stim.  Sensory:as above.   Pertinent Labs: UDS + for cocaine, benzos, THC   Impression: 53 yo M with status myoclonus s/p cardiac arrest. With burst suppression on initial EEG coupled with clinical movements, this is highly correlated with poor progonsis. Propofol can be used to control his movements, but if they continue to be refractory, treatment is unlikely to improve outcome. His movements are now controlled, I would not aggressively chase his EEG pattern as it will not likely improve outcome. I suspect tha the will not progress to brain death, but a head CT may be useful to determine this.   Recommendations: 1) continue keppra, depkote 2) head CT 3) continue family discussion regarding withdrawal of care.   This patient is critically ill and at significant risk of neurological worsening, death and care requires constant monitoring of vital signs, hemodynamics,respiratory and cardiac monitoring, neurological assessment, discussion with family, other specialists and medical decision making of high complexity. I spent 35 minutes of neurocritical care time  in the care of  this patient. This was time spent independent of any time provided by nurse practitioner or PA.  Roland Rack, MD [facility] [phone]  If 7pm- 7am, please page neurology on call as listed in [place]. 02/13/2019  8:07 AM

## 2019-02-13 NOTE — Progress Notes (Signed)
CRITICAL VALUE ALERT  Critical Value:  Potassium 2.7    Date & Time Notified: 02/13/19 1240    Provider Notified: June Leap, DO   Orders Received/Actions taken: Replacements ordered

## 2019-02-14 ENCOUNTER — Encounter (HOSPITAL_COMMUNITY): Payer: Self-pay | Admitting: Anesthesiology

## 2019-02-14 ENCOUNTER — Inpatient Hospital Stay (HOSPITAL_COMMUNITY): Payer: Medicare Other

## 2019-02-14 DIAGNOSIS — Z9911 Dependence on respirator [ventilator] status: Secondary | ICD-10-CM | POA: Diagnosis not present

## 2019-02-14 DIAGNOSIS — J9601 Acute respiratory failure with hypoxia: Secondary | ICD-10-CM | POA: Diagnosis not present

## 2019-02-14 DIAGNOSIS — I469 Cardiac arrest, cause unspecified: Secondary | ICD-10-CM | POA: Diagnosis not present

## 2019-02-14 DIAGNOSIS — G931 Anoxic brain damage, not elsewhere classified: Secondary | ICD-10-CM | POA: Diagnosis not present

## 2019-02-14 DIAGNOSIS — I4891 Unspecified atrial fibrillation: Secondary | ICD-10-CM

## 2019-02-14 LAB — POCT I-STAT 7, (LYTES, BLD GAS, ICA,H+H)
Acid-Base Excess: 3 mmol/L — ABNORMAL HIGH (ref 0.0–2.0)
Bicarbonate: 25.3 mmol/L (ref 20.0–28.0)
Calcium, Ion: 1.14 mmol/L — ABNORMAL LOW (ref 1.15–1.40)
HCT: 44 % (ref 39.0–52.0)
Hemoglobin: 15 g/dL (ref 13.0–17.0)
O2 Saturation: 98 %
Patient temperature: 98.1
Potassium: 3.1 mmol/L — ABNORMAL LOW (ref 3.5–5.1)
Sodium: 140 mmol/L (ref 135–145)
TCO2: 26 mmol/L (ref 22–32)
pCO2 arterial: 31.5 mmHg — ABNORMAL LOW (ref 32.0–48.0)
pH, Arterial: 7.513 — ABNORMAL HIGH (ref 7.350–7.450)
pO2, Arterial: 100 mmHg (ref 83.0–108.0)

## 2019-02-14 LAB — COMPREHENSIVE METABOLIC PANEL
ALT: 23 U/L (ref 0–44)
AST: 41 U/L (ref 15–41)
Albumin: 2.6 g/dL — ABNORMAL LOW (ref 3.5–5.0)
Alkaline Phosphatase: 53 U/L (ref 38–126)
Anion gap: 10 (ref 5–15)
BUN: 8 mg/dL (ref 6–20)
CO2: 24 mmol/L (ref 22–32)
Calcium: 8.1 mg/dL — ABNORMAL LOW (ref 8.9–10.3)
Chloride: 108 mmol/L (ref 98–111)
Creatinine, Ser: 1.02 mg/dL (ref 0.61–1.24)
GFR calc Af Amer: >60 mL/min (ref >60)
GFR calc non Af Amer: >60 mL/min (ref >60)
Glucose, Bld: 95 mg/dL (ref 70–99)
Potassium: 3.2 mmol/L — ABNORMAL LOW (ref 3.5–5.1)
Sodium: 142 mmol/L (ref 135–145)
Total Bilirubin: 0.7 mg/dL (ref 0.3–1.2)
Total Protein: 5.2 g/dL — ABNORMAL LOW (ref 6.5–8.1)

## 2019-02-14 LAB — CBC
HCT: 29.3 % — ABNORMAL LOW (ref 39.0–52.0)
Hemoglobin: 9.7 g/dL — ABNORMAL LOW (ref 13.0–17.0)
MCH: 30.8 pg (ref 26.0–34.0)
MCHC: 33.1 g/dL (ref 30.0–36.0)
MCV: 93 fL (ref 80.0–100.0)
Platelets: 132 10*3/uL — ABNORMAL LOW (ref 150–400)
RBC: 3.15 MIL/uL — ABNORMAL LOW (ref 4.22–5.81)
RDW: 14.1 % (ref 11.5–15.5)
WBC: 7.2 10*3/uL (ref 4.0–10.5)
nRBC: 0 % (ref 0.0–0.2)

## 2019-02-14 LAB — BLOOD GAS, ARTERIAL
Acid-Base Excess: 1.7 mmol/L (ref 0.0–2.0)
Bicarbonate: 24.9 mmol/L (ref 20.0–28.0)
FIO2: 100
O2 Saturation: 97.7 %
Patient temperature: 36.3
pCO2 arterial: 31.9 mmHg — ABNORMAL LOW (ref 32.0–48.0)
pH, Arterial: 7.5 — ABNORMAL HIGH (ref 7.350–7.450)
pO2, Arterial: 216 mmHg — ABNORMAL HIGH (ref 83.0–108.0)

## 2019-02-14 LAB — BASIC METABOLIC PANEL
Anion gap: 10 (ref 5–15)
BUN: 9 mg/dL (ref 6–20)
CO2: 26 mmol/L (ref 22–32)
Calcium: 8 mg/dL — ABNORMAL LOW (ref 8.9–10.3)
Chloride: 106 mmol/L (ref 98–111)
Creatinine, Ser: 1.06 mg/dL (ref 0.61–1.24)
GFR calc Af Amer: >60 mL/min (ref >60)
GFR calc non Af Amer: >60 mL/min (ref >60)
Glucose, Bld: 98 mg/dL (ref 70–99)
Potassium: 3.1 mmol/L — ABNORMAL LOW (ref 3.5–5.1)
Sodium: 142 mmol/L (ref 135–145)

## 2019-02-14 LAB — CBC WITH DIFFERENTIAL/PLATELET
Abs Immature Granulocytes: 0.03 10*3/uL (ref 0.00–0.07)
Basophils Absolute: 0 10*3/uL (ref 0.0–0.1)
Basophils Relative: 1 %
Eosinophils Absolute: 0.1 10*3/uL (ref 0.0–0.5)
Eosinophils Relative: 2 %
HCT: 28.5 % — ABNORMAL LOW (ref 39.0–52.0)
Hemoglobin: 9.6 g/dL — ABNORMAL LOW (ref 13.0–17.0)
Immature Granulocytes: 0 %
Lymphocytes Relative: 25 %
Lymphs Abs: 1.8 10*3/uL (ref 0.7–4.0)
MCH: 31.1 pg (ref 26.0–34.0)
MCHC: 33.7 g/dL (ref 30.0–36.0)
MCV: 92.2 fL (ref 80.0–100.0)
Monocytes Absolute: 0.5 10*3/uL (ref 0.1–1.0)
Monocytes Relative: 7 %
Neutro Abs: 4.5 10*3/uL (ref 1.7–7.7)
Neutrophils Relative %: 65 %
Platelets: 138 10*3/uL — ABNORMAL LOW (ref 150–400)
RBC: 3.09 MIL/uL — ABNORMAL LOW (ref 4.22–5.81)
RDW: 14 % (ref 11.5–15.5)
WBC: 7 10*3/uL (ref 4.0–10.5)
nRBC: 0 % (ref 0.0–0.2)

## 2019-02-14 LAB — PHOSPHORUS
Phosphorus: 3.3 mg/dL (ref 2.5–4.6)
Phosphorus: 3.5 mg/dL (ref 2.5–4.6)

## 2019-02-14 LAB — GLUCOSE, CAPILLARY
Glucose-Capillary: 75 mg/dL (ref 70–99)
Glucose-Capillary: 86 mg/dL (ref 70–99)
Glucose-Capillary: 90 mg/dL (ref 70–99)
Glucose-Capillary: 90 mg/dL (ref 70–99)
Glucose-Capillary: 91 mg/dL (ref 70–99)
Glucose-Capillary: 95 mg/dL (ref 70–99)
Glucose-Capillary: 98 mg/dL (ref 70–99)

## 2019-02-14 LAB — URINALYSIS, ROUTINE W REFLEX MICROSCOPIC
Glucose, UA: NEGATIVE mg/dL
Ketones, ur: 40 mg/dL — AB
Leukocytes,Ua: NEGATIVE
Nitrite: NEGATIVE
Protein, ur: 30 mg/dL — AB
Specific Gravity, Urine: 1.02 (ref 1.005–1.030)
pH: 7 (ref 5.0–8.0)

## 2019-02-14 LAB — URINALYSIS, MICROSCOPIC (REFLEX): Bacteria, UA: NONE SEEN

## 2019-02-14 LAB — BILIRUBIN, DIRECT: Bilirubin, Direct: 0.1 mg/dL (ref 0.0–0.2)

## 2019-02-14 LAB — MAGNESIUM
Magnesium: 2.2 mg/dL (ref 1.7–2.4)
Magnesium: 2 mg/dL (ref 1.7–2.4)

## 2019-02-14 LAB — ABO/RH: ABO/RH(D): B POS

## 2019-02-14 MED ORDER — DEXTROSE-NACL 5-0.45 % IV SOLN
INTRAVENOUS | Status: DC
  Administered 2019-02-14 – 2019-02-15 (×2): via INTRAVENOUS

## 2019-02-14 MED ORDER — POTASSIUM CHLORIDE 20 MEQ PO PACK
20.0000 meq | PACK | Freq: Two times a day (BID) | ORAL | Status: DC
  Administered 2019-02-14 – 2019-02-15 (×2): 20 meq via NASOGASTRIC
  Filled 2019-02-14 (×2): qty 1

## 2019-02-14 NOTE — Progress Notes (Signed)
Pts FIO2 increased to 100% per CDS for 02 challenge, sputum obtained and sent to lab as well.  RT will continue to monitor.

## 2019-02-14 NOTE — Progress Notes (Signed)
eLink Physician-Brief Progress Note Patient Name: Charles Martin. DOB: 1965/03/31 MRN: [id number]   Date of Service  02/14/2019  HPI/Events of Note  Going to OR in am for organ donation harvesting. UOP decreasing. Cr 1.02. Need fluids and K replacement  eICU Interventions  - Kcl 20 meq via NG tube - d5 half NS at 50 ml/hr ordered.      Intervention Category Intermediate Interventions: Oliguria - evaluation and management  Elmer Sow 02/14/2019, 9:54 PM

## 2019-02-14 NOTE — Progress Notes (Signed)
eLink Physician-Brief Progress Note Patient Name: Charles Martin. DOB: 10-Feb-1966 MRN: [id number]   Date of Service  02/14/2019  HPI/Events of Note  CDC asking for CMP, covid rapid test and urine cx before 8 AM tomorrow.   eICU Interventions  -ordered all three test     Intervention Category Minor Interventions: Other:  Elmer Sow 02/14/2019, 11:18 PM

## 2019-02-14 NOTE — Progress Notes (Signed)
NAME:  Charles Martin., MRN:  [id number], DOB:  1965-10-18, LOS: 2 ADMISSION DATE:  01/19/2019, CONSULTATION DATE:  02/12/2019 REFERRING MD:  Dr. Leonette Monarch, CHIEF COMPLAINT:  Cardiac arrest  Brief History   53 year old male presenting from home with cardiac arrest occurring during sexual intercourse.  Unclear down time without CPR, likely prolonged, prior to EMS finding in [facility], ROSC obtained after 13 mins.  Developed Afib w/RVR with ongoing hypotension in ER requiring vasopressor support.  Has remained encephalopathic not requiring sedation with jerking movements.  CTH neg.  CT abd/pelvis concerning duodenal injury.  PCCM called for admit.   History of present illness   53 year old male with history of tobacco abuse, GERD, and erectile dysfunction presenting from home with cardiac arrest.  Reportedly cardiac arrest occurred during sexual intercourse.  Patient had taken sildenafil prior to.  Also noted that a crack pipe and white powder was found with patient belongins.  Girlfriend reported patient had not been feeling well for several weeks but no infectious symptoms reported.  CPR was not started until EMS arrival.  Unclear down time patient was down without CPR as girlfriend was unable to move from underneath him. Found in [facility] with EMS.  ROSC obtained after 9 mins and two EPI.  On arrival to ER, patient remained unresponsive and hypotensive.  Temp 95.5.  4 L NS given.  EKG noted with diffuse ST segment depression.  Was given epi for severe hypotension episode when patient had brief spontaneously resolving Vtach. Was placed on levophed for hypotension.  Went into Afib with RVR requiring synchronized cardioversion which was unsuccessful.  He was started on amiodarone gtt.  Labs noted for glucose 220, sCr 1.56, AG 19, AST 42, hs-trop 6, lactic acid 6.7, normal coags, EKG in ER without acute ST elevation. CXR noted for satisfactory placement with likely atelectasis.  CT chest showing possible pulmonary  contusion with multiple rib and cartilage fractures.  CT abd/ pelvis concerning for duodenal injury with recs for repeated CT scan.  UA, UDS and COVID pending.  PCCM called for admit.   Past Medical History  Tobacco abuse, GERD, ED, diverticulosis  Significant Hospital Events   11/26 Ad[facility]  Consults:  Neurology   Procedures:  11/26 ETT >>  Significant Diagnostic Tests:  11/26 [facility] >> no acute intracranial abnormality   11/26 CTA chest/ abd/ pelvis >> 1. No acute aortic abnormality. 2. Large volume of intermediate to high attenuation material mostly surrounding the first and second portions of the duodenum. No sites of active contrast extravasation or free air is seen however.  Findings could reflect a duodenal perforation or injury either primary or secondary to resuscitative efforts versus a perforated duodenal ulcer. Potential options for further assessment could include a repeat, noncontrast CT of the abdomen to assess for accumulation of the intravenous contrast administered on this exam. 3. Bilateral rib and costal cartilage fractures as detailed above.  No pneumothorax. Patchy airspace disease in the anterior lungs may reflect associated pulmonary contusion. 4. Dependent airspace opacity likely reflecting further atelectatic change and volume loss in the setting of resuscitation. 5. Cardiomegaly and coronary artery atherosclerosis. 6. Colonic diverticulosis without evidence of diverticulitis. 7. Endotracheal tube terminates 3.6 cm from the carina. 8. Transesophageal tube tip and side port distal to the GE junction. 9. Aortic Atherosclerosis 10. Minimal atheromatous narrowing of the IMA origin.  Micro Data:  11/26 SARS CoV2 >> neg  Antimicrobials:  02/12/2019 Zosyn>>  Interim history/subjective:  Intubated, unresponsive   Objective   Blood pressure 107/65, pulse 66, temperature (!) 96.3 F (35.7 C), temperature source Esophageal, resp. rate (!) 25, height 6' (1.829  m), weight 106.6 kg, SpO2 99 %.    Vent Mode: PRVC FiO2 (%):  [50 %-60 %] 50 % Set Rate:  [25 bmp] 25 bmp Vt Set:  [id number] mL] 620 mL PEEP:  [5 cmH20] 5 cmH20 Plateau Pressure:  [19 [id number] cmH20] 21 cmH20   Intake/Output Summary (Last 24 hours) at 02/14/2019 1221 Last data filed at 02/14/2019 1200 Gross per 24 hour  Intake 1582.78 ml  Output 2370 ml  Net -787.22 ml   Filed Weights   02/12/19 0800 02/13/19 0300 02/14/19 0330  Weight: 99.8 kg 108.7 kg 106.6 kg    Examination: General appearance: 53 y.o., male, intubated on life support  Eyes: not tracking, light response present  HENT: NCAT, sclera clear Neck: ET tube in place Lungs: Bilateral ventilated breath sounds CV: Regular rate and rhythm, S1-S2 Abdomen: Soft nontender nondistended Extremities: Dependent upper and lower extremity edema Skin: Normal temperature Neuro: Sedated unresponsive mild clonus   Resolved Hospital Problem list    Assessment & Plan:   Tonic-clonic seizure activity Polysubstance abuse Extended downtime Acute anoxic encephalopathy Anoxic brain injury P:  Appreciate neurology input Agree with poor prognosis GPEDS with flat EEG background post cardiac arrest  Currently DNR. Recommend palliative withdrawal of care and comfort measures once family ready. Appreciate palliative care consultation.  And their input.  Cardiac arrest Likely secondary to vasospasm in the setting of cocaine use P:  Hemodynamically stable Remains intubated secondary to this   Acute respiratory failure requiring intubation and mechanical ventilation secondary to cardiac arrest Question of possible pulmonary contusion on CT chest w/Bilateral rib and costal cartilage fractures P:  Remains on mechanical support Does not wish to have prolonged mechanical support per discussions with family   New onset Afib with RVR P:  Switch to p.o. amiodarone rate control  Acute renal failure Anion gap metabolic acidosis  P:  Follow urine output  R/o duodenal injury/ rupture  Recent Labs    02/14/19 0327 02/14/19 0347  HGB 15.0 9.6*   P:  PPI continue Follow CBC, H&H stable On empiric Zosyn Discussed with surgery no surgical candidate  Hyperglycemia CBG (last 3)  Recent Labs    02/14/19 0348 02/14/19 0749 02/14/19 1134  GLUCAP 90 75 86  P:  SSI    Best practice:  Diet: NPO Pain/Anxiety/Delirium protocol (if indicated): Currently on propofol drip VAP protocol (if indicated): yes DVT prophylaxis: SCDs, heparin SQ GI prophylaxis: PPI BID Glucose control: CBG q 4, add SSI if > 180 Mobility: BR Code Status: DO NOT RESUSCITATE Family Communication: Update to mother. Disposition: ICU   Labs   CBC: Recent Labs  Lab 02/12/19 0447  02/12/19 1250 02/12/19 2030 02/13/19 0450 02/13/19 1125 02/14/19 0327 02/14/19 0347  WBC 11.8*  --  8.8 7.5 7.5  --   --  7.0  NEUTROABS  --   --   --   --   --   --   --  4.5  HGB 11.9*   < > 10.7* 10.4* 9.5* 9.2* 15.0 9.6*  HCT 36.1*   < > 31.3* 30.7* 28.5* 27.0* 44.0 28.5*  MCV 93.3  --  91.0 91.6 92.5  --   --  92.2  PLT 214  --  187 161 139*  --   --  138*   < > = values  in this interval not displayed.    Basic Metabolic Panel: Recent Labs  Lab 02/06/2019 2342 02/12/19 0019  02/12/19 0447 02/12/19 0457 02/13/19 1116 02/13/19 1125 02/14/19 0327 02/14/19 0347  NA 137 137   < > 139 140 141 140 140 142  K 4.0 3.8   < > 4.2 4.1 2.7* 2.7* 3.1* 3.1*  CL 103 104  --  108  --  102  --   --  106  CO2 15*  --   --  21*  --  26  --   --  26  GLUCOSE 220* 215*  --  124*  --  93  --   --  98  BUN 16 18  --  14  --  11  --   --  9  CREATININE 1.56* 1.50*  --  1.10  --  1.22  --   --  1.06  CALCIUM 8.8*  --   --  7.4*  --  7.7*  --   --  8.0*  MG 2.5*  --   --  1.9  --   --   --   --  2.2  PHOS  --   --   --  3.2  --   --   --   --  3.3   < > = values in this interval not displayed.   GFR: Estimated Creatinine Clearance: 101.7 mL/min (by C-G  formula based on SCr of 1.06 mg/dL). Recent Labs  Lab 02/16/2019 2342 02/12/19 0239 02/12/19 0409  02/12/19 1250 02/12/19 2030 02/13/19 0450 02/13/19 1116 02/14/19 0347  WBC 8.4  --   --    < > 8.8 7.5 7.5  --  7.0  LATICACIDVEN 6.7* 3.4* 2.9*  --   --   --   --  1.4  --    < > = values in this interval not displayed.    Liver Function Tests: Recent Labs  Lab 02/01/2019 2342  AST 42*  ALT 34  ALKPHOS 66  BILITOT 0.4  PROT 6.8  ALBUMIN 4.1   No results for input(s): LIPASE, AMYLASE in the last 168 hours. No results for input(s): AMMONIA in the last 168 hours.  ABG    Component Value Date/Time   PHART 7.513 (H) 02/14/2019 0327   PCO2ART 31.5 (L) 02/14/2019 0327   PO2ART 100.0 02/14/2019 0327   HCO3 25.3 02/14/2019 0327   TCO2 26 02/14/2019 0327   ACIDBASEDEF 4.0 (H) 02/12/2019 0457   O2SAT 98.0 02/14/2019 0327     Coagulation Profile: Recent Labs  Lab 01/30/2019 2342  INR 1.0    Cardiac Enzymes: No results for input(s): CKTOTAL, CKMB, CKMBINDEX, TROPONINI in the last 168 hours.  HbA1C: Hemoglobin A1C  Date/Time Value Ref Range Status  07/28/2013 5.5  Final   Hgb A1c MFr Bld  Date/Time Value Ref Range Status  02/12/2019 04:47 AM 5.6 4.8 - 5.6 % Final    Comment:    (NOTE) Pre diabetes:          5.7%-6.4% Diabetes:              >6.4% Glycemic control for   <7.0% adults with diabetes   04/15/2017 09:12 AM 5.5 4.8 - 5.6 % Final    Comment:             Prediabetes: 5.7 - 6.4          Diabetes: >6.4          Glycemic  control for adults with diabetes: <7.0     CBG: Recent Labs  Lab 02/13/19 1942 02/14/19 0016 02/14/19 0348 02/14/19 0749 02/14/19 1134  GLUCAP 97 90 90 75 86    This patient is critically ill with multiple organ system failure; which, requires frequent high complexity decision making, assessment, support, evaluation, and titration of therapies. This was completed through the application of advanced monitoring technologies and  extensive interpretation of multiple databases. During this encounter critical care time was devoted to patient care services described in this note for 31 minutes.  Garner Nash, DO [facility] 02/14/2019 12:21 PM

## 2019-02-14 NOTE — Progress Notes (Addendum)
Subjective: No significant change.    Exam: Vitals:   02/14/19 0700 02/14/19 0800  BP: (!) 142/84 (!) 142/87  Pulse: 63 65  Resp: (!) 25 (!) 25  Temp: (!) 96.8 F (36 C) (!) 97.5 F (36.4 C)  SpO2: 100% 100%   Gen: In bed, intubated Resp: ventilated Abd: soft, nt  Neuro: MS: Eyes do not open CN: PERRL, corneal present on the right, but not left.  Motor: nox stim precipitates myoclonus Sensory:as above.   CT head - I question some blurring posteriorly.   Impression: 53 yo M with status myoclonus s/p cardiac arrest. With burst suppression on initial EEG coupled with clinical movements, this is highly correlated with poor progonsis. This progressed to GPEDs on a flat background. With the refractory nature and dismal exam, I do not feel that he has any chance at recovery to an independent level of function.   Propofol can be used to control these movements, but does not affect ultimate outcome. Family is aware of poor prognosis. Without extensive edema on head CT, I doubt he will progress to brain death, but do not feel that further imaging is likely to add prognostic value.   Recommendations: 1) continue keppra, depkote 2) continue family discussion regarding withdrawal of care.   Roland Rack, MD [facility] [phone]  If 7pm- 7am, please page neurology on call as listed in [place]. 02/14/2019  8:16 AM

## 2019-02-15 ENCOUNTER — Encounter (HOSPITAL_COMMUNITY): Admission: EM | Disposition: E | Payer: Self-pay | Source: Home / Self Care | Attending: Pulmonary Disease

## 2019-02-15 ENCOUNTER — Inpatient Hospital Stay (HOSPITAL_COMMUNITY): Payer: Medicare Other

## 2019-02-15 DIAGNOSIS — Z005 Encounter for examination of potential donor of organ and tissue: Secondary | ICD-10-CM | POA: Diagnosis not present

## 2019-02-15 HISTORY — PX: ORGAN PROCUREMENT: SHX5270

## 2019-02-15 LAB — COMPREHENSIVE METABOLIC PANEL
ALT: 20 U/L (ref 0–44)
AST: 40 U/L (ref 15–41)
Albumin: 2.3 g/dL — ABNORMAL LOW (ref 3.5–5.0)
Alkaline Phosphatase: 46 U/L (ref 38–126)
Anion gap: 9 (ref 5–15)
BUN: 7 mg/dL (ref 6–20)
CO2: 23 mmol/L (ref 22–32)
Calcium: 8 mg/dL — ABNORMAL LOW (ref 8.9–10.3)
Chloride: 109 mmol/L (ref 98–111)
Creatinine, Ser: 0.96 mg/dL (ref 0.61–1.24)
GFR calc Af Amer: >60 mL/min (ref >60)
GFR calc non Af Amer: >60 mL/min (ref >60)
Glucose, Bld: 102 mg/dL — ABNORMAL HIGH (ref 70–99)
Potassium: 3.5 mmol/L (ref 3.5–5.1)
Sodium: 141 mmol/L (ref 135–145)
Total Bilirubin: 0.6 mg/dL (ref 0.3–1.2)
Total Protein: 4.9 g/dL — ABNORMAL LOW (ref 6.5–8.1)

## 2019-02-15 LAB — TRIGLYCERIDES: Triglycerides: 260 mg/dL — ABNORMAL HIGH (ref <150)

## 2019-02-15 LAB — POCT I-STAT 7, (LYTES, BLD GAS, ICA,H+H)
Acid-Base Excess: 1 mmol/L (ref 0.0–2.0)
Bicarbonate: 23.9 mmol/L (ref 20.0–28.0)
Calcium, Ion: 1.14 mmol/L — ABNORMAL LOW (ref 1.15–1.40)
HCT: 24 % — ABNORMAL LOW (ref 39.0–52.0)
Hemoglobin: 8.2 g/dL — ABNORMAL LOW (ref 13.0–17.0)
O2 Saturation: 99 %
Patient temperature: 98.6
Potassium: 3.5 mmol/L (ref 3.5–5.1)
Sodium: 141 mmol/L (ref 135–145)
TCO2: 25 mmol/L (ref 22–32)
pCO2 arterial: 31.4 mmHg — ABNORMAL LOW (ref 32.0–48.0)
pH, Arterial: 7.49 — ABNORMAL HIGH (ref 7.350–7.450)
pO2, Arterial: 109 mmHg — ABNORMAL HIGH (ref 83.0–108.0)

## 2019-02-15 LAB — GLUCOSE, CAPILLARY
Glucose-Capillary: 100 mg/dL — ABNORMAL HIGH (ref 70–99)
Glucose-Capillary: 79 mg/dL (ref 70–99)

## 2019-02-15 LAB — TYPE AND SCREEN
ABO/RH(D): B POS
Antibody Screen: NEGATIVE

## 2019-02-15 LAB — SAR COV2 SEROLOGY (COVID19)AB(IGG),IA: SARS-CoV-2 Ab, IgG: NONREACTIVE

## 2019-02-15 LAB — SARS CORONAVIRUS 2 BY RT PCR (HOSPITAL ORDER, PERFORMED IN ~~LOC~~ HOSPITAL LAB): SARS Coronavirus 2: NEGATIVE

## 2019-02-15 SURGERY — SURGICAL PROCUREMENT, ORGAN
Anesthesia: Choice | Site: Abdomen

## 2019-02-15 MED ORDER — 0.9 % SODIUM CHLORIDE (POUR BTL) OPTIME
TOPICAL | Status: DC | PRN
  Administered 2019-02-15: 6000 mL

## 2019-02-15 MED ORDER — ACETAMINOPHEN 325 MG PO TABS: 650.0000 mg | ORAL_TABLET | Freq: Four times a day (QID) | ORAL | Status: DC | PRN

## 2019-02-15 MED ORDER — POLYVINYL ALCOHOL 1.4 % OP SOLN
1.0000 [drp] | Freq: Four times a day (QID) | OPHTHALMIC | Status: DC | PRN
  Filled 2019-02-15: qty 15

## 2019-02-15 MED ORDER — LORAZEPAM 2 MG/ML IJ SOLN
4.0000 mg | INTRAMUSCULAR | Status: DC | PRN
  Administered 2019-02-15: 4 mg via INTRAVENOUS
  Filled 2019-02-15: qty 2

## 2019-02-15 MED ORDER — GLYCOPYRROLATE 0.2 MG/ML IJ SOLN: 0.2000 mg | INTRAMUSCULAR | Status: DC | PRN

## 2019-02-15 MED ORDER — 0.9 % SODIUM CHLORIDE (POUR BTL) OPTIME
TOPICAL | Status: DC | PRN
  Administered 2019-02-15: 1000 mL

## 2019-02-15 MED ORDER — GLYCOPYRROLATE 0.2 MG/ML IJ SOLN
0.2000 mg | INTRAMUSCULAR | Status: DC | PRN
  Administered 2019-02-15: 0.2 mg via INTRAVENOUS
  Filled 2019-02-15: qty 1

## 2019-02-15 MED ORDER — DIPHENHYDRAMINE HCL 50 MG/ML IJ SOLN
25.0000 mg | INTRAMUSCULAR | Status: DC | PRN
  Administered 2019-02-15: 25 mg via INTRAVENOUS
  Filled 2019-02-15: qty 1

## 2019-02-15 MED ORDER — ACETAMINOPHEN 650 MG RE SUPP: 650.0000 mg | Freq: Four times a day (QID) | RECTAL | Status: DC | PRN

## 2019-02-15 MED ORDER — FENTANYL CITRATE (PF) 100 MCG/2ML IJ SOLN
50.0000 ug | INTRAMUSCULAR | Status: DC | PRN
  Administered 2019-02-15 (×2): 100 ug via INTRAVENOUS

## 2019-02-15 MED ORDER — GLYCOPYRROLATE 1 MG PO TABS
1.0000 mg | ORAL_TABLET | ORAL | Status: DC | PRN
  Filled 2019-02-15: qty 1

## 2019-02-15 MED ORDER — DEXTROSE 5 % IV SOLN
INTRAVENOUS | Status: DC
  Administered 2019-02-15: 250 mL via INTRAVENOUS

## 2019-02-15 SURGICAL SUPPLY — 34 items
BLADE CLIPPER SURG (BLADE) ×3 IMPLANT
BLADE SAW STERNAL (BLADE) ×3 IMPLANT
CONT SPEC 4OZ CLIKSEAL STRL BL (MISCELLANEOUS) ×6 IMPLANT
COVER MAYO STAND STRL (DRAPES) ×3 IMPLANT
COVER SURGICAL LIGHT HANDLE (MISCELLANEOUS) ×3 IMPLANT
DRAPE HALF SHEET 40X57 (DRAPES) ×9 IMPLANT
DRAPE SLUSH MACHINE 52X66 (DRAPES) ×6 IMPLANT
DRSG COVADERM 4X6 (GAUZE/BANDAGES/DRESSINGS) ×9 IMPLANT
DRSG TELFA 3X8 NADH (GAUZE/BANDAGES/DRESSINGS) ×3 IMPLANT
DURAPREP 26ML APPLICATOR (WOUND CARE) ×3 IMPLANT
ELECT REM PT RETURN 9FT ADLT (ELECTROSURGICAL)
ELECTRODE REM PT RTRN 9FT ADLT (ELECTROSURGICAL) IMPLANT
GLOVE BIO SURGEON STRL SZ7 (GLOVE) ×9 IMPLANT
GLOVE BIO SURGEON STRL SZ7.5 (GLOVE) ×3 IMPLANT
GOWN STRL REUS W/ TWL LRG LVL3 (GOWN DISPOSABLE) ×4 IMPLANT
GOWN STRL REUS W/ TWL XL LVL3 (GOWN DISPOSABLE) ×1 IMPLANT
GOWN STRL REUS W/TWL 2XL LVL3 (GOWN DISPOSABLE) ×3 IMPLANT
GOWN STRL REUS W/TWL LRG LVL3 (GOWN DISPOSABLE) ×12
GOWN STRL REUS W/TWL XL LVL3 (GOWN DISPOSABLE) ×3
HANDLE SUCTION POOLE (INSTRUMENTS) ×1 IMPLANT
KIT POST MORTEM ADULT 36X90 (BAG) ×3 IMPLANT
KIT TURNOVER KIT B (KITS) ×3 IMPLANT
MANIFOLD NEPTUNE II (INSTRUMENTS) ×3 IMPLANT
NS IRRIG 1000ML POUR BTL (IV SOLUTION) ×21 IMPLANT
PACK AORTA (CUSTOM PROCEDURE TRAY) ×3 IMPLANT
PAD ARMBOARD 7.5X6 YLW CONV (MISCELLANEOUS) ×6 IMPLANT
PENCIL BUTTON HOLSTER BLD 10FT (ELECTRODE) IMPLANT
STAPLER VISISTAT 35W (STAPLE) ×3 IMPLANT
SUCTION POOLE HANDLE (INSTRUMENTS) ×3
SUT ETHILON 1 LR 30 (SUTURE) ×6 IMPLANT
SUT SILK 2 0 SH CR/8 (SUTURE) ×3 IMPLANT
TUBE CONNECTING 12'X1/4 (SUCTIONS) ×2
TUBE CONNECTING 12X1/4 (SUCTIONS) ×4 IMPLANT
YANKAUER SUCT BULB TIP NO VENT (SUCTIONS) ×3 IMPLANT

## 2019-02-16 ENCOUNTER — Encounter (HOSPITAL_COMMUNITY): Payer: Self-pay

## 2019-02-16 LAB — URINE CULTURE: Culture: NO GROWTH

## 2019-02-17 LAB — CULTURE, BLOOD (ROUTINE X 2)
Culture: NO GROWTH
Culture: NO GROWTH
Special Requests: ADEQUATE

## 2019-02-17 LAB — DRUG PROFILE, UR, 9 DRUGS (LABCORP)
Amphetamines, Urine: NEGATIVE ng/mL
Barbiturate, Ur: NEGATIVE ng/mL
Benzodiazepine Quant, Ur: NEGATIVE ng/mL
Cannabinoid Quant, Ur: POSITIVE ng/mL — AB
Cocaine (Metab.): POSITIVE ng/mL — AB
Methadone Screen, Urine: NEGATIVE ng/mL
Opiate Quant, Ur: NEGATIVE ng/mL
Phencyclidine, Ur: NEGATIVE ng/mL
Propoxyphene, Urine: NEGATIVE ng/mL

## 2019-02-17 LAB — SURGICAL PATHOLOGY

## 2019-02-17 NOTE — OR Nursing (Signed)
X- ray taken prior to closing. No retained objects seen on x-ray per Dr. Jimmye Norman, MD. Dr. Haskel Schroeder informed.

## 2019-02-17 NOTE — Progress Notes (Signed)
PCCM:  Patient being taken to OR by [facility] for withdrawal of care and possible donor after cardiac death.   I have spoke with the patients mother.   Comfort care orders have been placed and withdrawal of care orders placed to be completed in the OR pending recommendations from [facility].    Garner Nash, DO [facility] 03-04-2019 9:32 AM

## 2019-02-17 NOTE — Progress Notes (Signed)
Pronounced at 1310 no auscultated heartbeat and cessation of breath.@ Pronounced  with Philis Kendall Burthon RN and Alma Friendly RN  0 on the cardiac monitor, no BP  Via aline flat. Medications as documented on MAR.  Patient in OR turned over to [facility]S.

## 2019-02-17 NOTE — OR Nursing (Signed)
Time of death at 37 per Alma Friendly, RN DNP, and Julaine Fusi, RN.

## 2019-02-17 NOTE — Progress Notes (Signed)
Wasted fentanyl bag 200 ml and versed bag 40 ml with Julaine Fusi RN

## 2019-02-17 NOTE — Progress Notes (Signed)
Placed DNR wristband at 1050 per protocol.

## 2019-02-17 NOTE — Progress Notes (Signed)
Confirmed ME case per Lanny Hurst from [place] office

## 2019-02-17 DEATH — deceased

## 2019-02-18 DIAGNOSIS — G931 Anoxic brain damage, not elsewhere classified: Secondary | ICD-10-CM

## 2019-02-18 DIAGNOSIS — E872 Acidosis, unspecified: Secondary | ICD-10-CM

## 2019-02-18 DIAGNOSIS — R579 Shock, unspecified: Secondary | ICD-10-CM

## 2019-02-18 DIAGNOSIS — F1490 Cocaine use, unspecified, uncomplicated: Secondary | ICD-10-CM

## 2019-02-18 DIAGNOSIS — I4891 Unspecified atrial fibrillation: Secondary | ICD-10-CM

## 2019-02-18 DIAGNOSIS — N179 Acute kidney failure, unspecified: Secondary | ICD-10-CM

## 2019-02-18 LAB — CULTURE, RESPIRATORY W GRAM STAIN: Gram Stain: NONE SEEN

## 2019-02-19 LAB — CULTURE, BLOOD (ROUTINE X 2)
Culture: NO GROWTH
Culture: NO GROWTH
Special Requests: ADEQUATE
Special Requests: ADEQUATE

## 2019-03-20 NOTE — Death Summary Note (Signed)
DEATH SUMMARY   Patient Details  Name: Charles Martin. MRN: [id number] DOB: [birth year]  Admission/Discharge Information   Admit Date:  Feb 17, 2019  Date of Death: Date of Death: 02-21-2019  Time of Death: Time of Death: 72  Length of Stay: 3  Referring Physician: [name], DO   Reason(s) for Hospitalization  54 year old male presenting from home with cardiac arrest occurring during sexual intercourse.  Unclear down time without CPR, likely prolonged, prior to EMS finding in [facility], ROSC obtained after 13 mins.  Developed Afib w/RVR with ongoing hypotension in ER requiring vasopressor support.  Has remained encephalopathic not requiring sedation with jerking movements.  CTH neg.  CT abd/pelvis concerning duodenal injury.  PCCM called for admit.   Diagnoses  Preliminary cause of death: Anoxic brain injury ([place]) Secondary Diagnoses (including complications and co-morbidities):  Active Problems:   Current every day smoker-about 20-25-pack-year history.   Social drinker   Family history of diabetes mellitus in father- age 58   Cardiac arrest ([place])   Shock ([place])   Metabolic acidosis   Atrial fibrillation with RVR ([place])   Cocaine use   Anoxic brain injury ([place])   Acute renal failure (ARF) [facility])   Brief Hospital Course (including significant findings, care, treatment, and services provided and events leading to death)  Charles Martin. is a 54 y.o. year old male who history of tobacco abuse, GERD, and erectile dysfunction presenting from home with cardiac arrest.  Reportedly cardiac arrest occurred during sexual intercourse.  Patient had taken sildenafil prior to.  Also noted that a crack pipe and white powder was found with patient belongins.  Girlfriend reported patient had not been feeling well for several weeks but no infectious symptoms reported.  CPR was not started until EMS arrival.  Unclear down time patient was down without CPR as girlfriend was unable to move from  underneath him. Found in PEA with EMS.  ROSC obtained after 9 mins and two EPI.  On arrival to ER, patient remained unresponsive and hypotensive.  Temp 95.5.  4 L NS given.  EKG noted with diffuse ST segment depression.  Was given epi for severe hypotension episode when patient had brief spontaneously resolving Vtach. Was placed on levophed for hypotension.  Went into Afib with RVR requiring synchronized cardioversion which was unsuccessful.  He was started on amiodarone gtt.  Labs noted for glucose 220, sCr 1.56, AG 19, AST 42, hs-trop 6, lactic acid 6.7, normal coags, EKG in ER without acute ST elevation. CXR noted for satisfactory placement with likely atelectasis.  CT chest showing possible pulmonary contusion with multiple rib and cartilage fractures.  CT abd/ pelvis concerning for duodenal injury with recs for repeated CT scan.  UA, UDS and COVID negative.  PCCM called for admit.   Patient ultimately had eeg findings and physical exam findings of severe anoxic brain injury. The patients family was approached by palliative care as well as CDS. The decision was made to pursue donor services after cardiac death.   The patient was taken to the OR by [place] donor services. The patient was pronounced deceased in the OR. Please see separate documentation.    Pertinent Labs and Studies  Significant Diagnostic Studies Ct Abdomen Pelvis Wo Contrast  Result Date: 02/12/2019 CLINICAL DATA:  Assess for delayed venous bleeding. Possible duodenal injury. EXAM: CT ABDOMEN AND PELVIS WITHOUT CONTRAST TECHNIQUE: Multidetector CT imaging of the abdomen and pelvis was performed following the standard protocol without IV contrast. COMPARISON:  CTA 02/12/2019 FINDINGS: Lower chest: Redemonstrated rib fractures with opacity in the anterior lungs likely reflecting pulmonary contusions and dependent atelectasis posteriorly. Hepatobiliary: No focal liver abnormality is seen. No gallstones, gallbladder wall thickening, or  biliary dilatation. Pancreas: No focal peripancreatic inflammation. Small amount of fluid adjacent the pancreatic head and uncinate is likely part of the larger collection seen more inferiorly. Spleen: Normal in size without focal abnormality. Adrenals/Urinary Tract: Stable fluid attenuation cyst in the upper pole left kidney. Excreted contrast material within the collecting system. Mild bilateral perinephric stranding is similar to prior. Excreted contrast material is collect within the bladder with a layering fluid contrast level. Stomach/Bowel: Transesophageal tube tip and side port distal to the GE junction, terminating in the gastric body. There is continued indistinctness and hypoattenuation the first and second portions of the duodenum with extensive surrounding intermediate attenuation fluid which is similar to slightly decreased in attenuation from comparison study. There is some increasing fluid tracking into the perihepatic space. More distal small bowel is fluid-filled but otherwise unremarkable. No acute colonic abnormality. Colonic diverticulosis without evidence of diverticulitis. Vascular/Lymphatic: Atherosclerotic plaque within the normal caliber aorta. No suspicious or enlarged lymph nodes in the included lymphatic chains. Reproductive: The prostate and seminal vesicles are unremarkable. Other: Hyperdense fluid in the upper abdomen and mesenteric root again appears largely centered upon the duodenal sweep without evidence contrast accumulation within the collection. There is some increasing perihepatic fluid which appears to track from this large collection. No free air is evident. Musculoskeletal: Multiple rib and costal cartilage fractures described previously. No significant interval change. Enthesopathic changes on the iliac crests. IMPRESSION: 1. Continued indistinctness and hypoattenuation of the first and second portions of the duodenum with extensive surrounding intermediate attenuation  fluid which is similar to slightly decreased in attenuation from comparison study. There is lack of contrast accumulation to suggest active hemorrhage. There is overall increase in volume with some increasing perihepatic fluid which appears to track from this large collection seen more inferiorly. No free air. 2. Transesophageal tube tip and side port distal to the GE junction, terminating in the gastric body. 3. Redemonstrated multiple rib and costal cartilage fractures. 4. Aortic Atherosclerosis (ICD10-I70.0). Electronically Signed   By: Lovena Le M.D.   On: 02/12/2019 03:37   Dg Abd 1 View  Result Date: 03/13/2019 CLINICAL DATA:  Organ procurement. EXAM: ABDOMEN - 1 VIEW COMPARISON:  None. FINDINGS: A punctate focus of high attenuation is projected over the left upper quadrant of the abdomen, probably a soft tissue calcification such as a renal stone. No surgical instruments are seen. IMPRESSION: No retained surgical instruments. Electronically Signed   By: Dorise Bullion III M.D   On: 2019-03-13 14:51   Ct Head Wo Contrast  Result Date: 02/13/2019 CLINICAL DATA:  Abnormal EEG, history of cardiac arrest EXAM: CT HEAD WITHOUT CONTRAST TECHNIQUE: Contiguous axial images were obtained from the base of the skull through the vertex without intravenous contrast. COMPARISON:  02/12/2019 FINDINGS: Brain: There is no acute intracranial hemorrhage, mass-effect, or edema. Gray-white differentiation is preserved. There is no extra-axial fluid collection. Ventricles and sulci are within normal limits in size and configuration. Vascular: No acute abnormality. Skull: No acute abnormality. Sinuses/Orbits: Polypoid partial opacification of the paranasal sinuses with greatest involvement of the ethmoids and left maxillary sinus. Other: Nonspecific opacification of the nasal cavity and visualized pharynx, likely retained secretions. Right posterior scalp soft tissue swelling. IMPRESSION: No acute intracranial  hemorrhage or evidence of evolving recent infarction. There is  no evidence of hypoxic/ischemic injury at this time on CT; consider MRI when possible. Electronically Signed   By: Macy Mis M.D.   On: 02/13/2019 16:16   Ct Head Wo Contrast  Result Date: 02/12/2019 CLINICAL DATA:  Altered level of consciousness following cardiac arrest during sexual activity EXAM: CT HEAD WITHOUT CONTRAST TECHNIQUE: Contiguous axial images were obtained from the base of the skull through the vertex without intravenous contrast. COMPARISON:  None. FINDINGS: Brain: No evidence of acute infarction, hemorrhage, hydrocephalus, extra-axial collection or mass lesion/mass effect. Vascular: No hyperdense vessel or unexpected calcification. Skull: Normal. Negative for fracture or focal lesion. Sinuses/Orbits: No acute finding. Other: None. IMPRESSION: No acute intracranial abnormality noted. Electronically Signed   By: Inez Catalina M.D.   On: 02/12/2019 01:32   Dg Chest Port 1 View  Result Date: 02/14/2019 CLINICAL DATA:  54 year old male under evaluation for pulmonary infiltrates. EXAM: PORTABLE CHEST 1 VIEW COMPARISON:  Chest x-ray 02/14/2019. FINDINGS: An endotracheal tube is in place with tip 6.0 cm above the carina. Nasogastric tube extending into the stomach, coiled retrograde with tip distal to the gastroesophageal junction. Lung volumes are low with bibasilar opacities (left greater than right) which may reflect areas of atelectasis and/or consolidation. Small left pleural effusion. No right pleural effusion. No evidence of pulmonary edema. Heart size is normal. Upper mediastinal contours are distorted by patient's rotation to the left. IMPRESSION: 1. Support apparatus, as above. 2. Low lung volumes with bibasilar opacities (left greater than right) which may reflect areas of atelectasis and/or consolidation, with small left pleural effusion. Electronically Signed   By: Vinnie Langton M.D.   On: 02/14/2019 17:43   Am  Dg Chest Port 1 View  Result Date: 02/14/2019 CLINICAL DATA:  Respiratory failure. Status post CPR. EXAM: PORTABLE CHEST 1 VIEW COMPARISON:  Chest radiograph 01/20/2019 and CTA 02/12/2019 FINDINGS: Endotracheal tube terminates at the level of the clavicles. Enteric tube terminates over the proximal stomach. The cardiomediastinal silhouette is unchanged. Lung volumes are low with mild central pulmonary vascular congestion without overt edema. There is increased opacity in the left lung base likely reflecting atelectasis and a small pleural effusion. There is also mild right basilar opacity. No pneumothorax is identified. IMPRESSION: Low lung volumes with increasing left greater than right basilar opacities likely reflecting atelectasis and a small left pleural effusion. Electronically Signed   By: Logan Bores M.D.   On: 02/14/2019 08:06   Dg Chest Port 1 View  Result Date: 02/12/2019 CLINICAL DATA:  Post CPR, intubation and OG placement EXAM: PORTABLE CHEST 1 VIEW COMPARISON:  None. FINDINGS: Endotracheal tube terminates in the mid trachea 4.3 cm from the carina. Transesophageal tube tip and side port distal to the GE junction, curling in the left upper quadrant. Pacer pads overlie the chest. Additional cardiac monitoring leads overlie the chest as well. There are some streaky opacities in the lungs favoring atelectasis with some perihilar haze and bronchovascular thickening which could reflect mild pulmonary venous congestion or edema. No pneumothorax. No effusion. Question a minimally displaced left posterior seventh rib fracture possibly related to resuscitative efforts. Degenerative changes are present in the imaged spine and shoulders. IMPRESSION: 1. Endotracheal tube terminates in the mid trachea 4.3 cm from the carina. 2. Transesophageal tube beyond the GE junction, curling in the left upper quadrant. 3. Streaky opacities in the lungs favoring atelectasis with some perihilar haze and bronchovascular  thickening which could reflect mild pulmonary venous congestion and or edema. Electronically Signed   By:  Lovena Le M.D.   On: 02/12/2019 00:02   Ct Angio Chest/abd/pel For Dissection W And/or Wo Contrast  Result Date: 02/12/2019 CLINICAL DATA:  Cardiac arrest EXAM: CT ANGIOGRAPHY CHEST, ABDOMEN AND PELVIS TECHNIQUE: Multidetector CT imaging through the chest, abdomen and pelvis was performed using the standard protocol during bolus administration of intravenous contrast. Multiplanar reconstructed images and MIPs were obtained and reviewed to evaluate the vascular anatomy. CONTRAST:  156mL OMNIPAQUE IOHEXOL 350 MG/ML SOLN COMPARISON:  Chest radiograph 02/13/2019 FINDINGS: CTA CHEST FINDINGS Cardiovascular: Noncontrast CT of the chest demonstrates a normal caliber atheromatous aorta without abnormal hyperattenuating mural thickening or plaque displacement to suggest intramural hematoma. Postcontrast arterial phase imaging demonstrates preferential opacification of the aorta. No acute luminal abnormality periaortic stranding or hemorrhage is seen. Normal 3 vessel branching of the aortic arch. Proximal great vessels opacify normally. Major venous structures are unremarkable. Mild cardiomegaly with biatrial enlargement. No pericardial effusion. Coronary artery atherosclerosis is present. Central pulmonary arteries are normal caliber. No large central filling defects on this non tailored examination. Mediastinum/Nodes: No mediastinal gas or hemorrhage. No enlarged mediastinal, hilar, or axillary lymph nodes. Thyroid gland, trachea, and esophagus demonstrate no significant findings. Endotracheal tube terminates 3.6 cm from the carina. A transesophageal tube tip and side port distal to the GE junction. Lungs/Pleura: There are some subpleural areas opacity in the anterior chest which could reflect atelectasis or contusion in the setting of resuscitation. Additional dependent airspace opacity is present as well  likely reflecting further atelectatic change and volume loss. No pneumothorax is seen. Musculoskeletal: Minimally displaced right second through sixth anterolateral rib fractures with fractures of the right seventh and eighth costal cartilages as well. There are new nondisplaced and minimally displaced fractures of the left second through eighth rib including a segmental fracture of the seventh with costal cartilage fracture. No sternal fracture. Multilevel degenerative changes in the spine. Review of the MIP images confirms the above findings. CTA ABDOMEN AND PELVIS FINDINGS VASCULAR Aorta: Atheromatous plaque throughout the abdominal aorta. No acute luminal abnormality. Atherosclerotic plaque throughout the course of the aorta. No aneurysm, ectasia or features of vasculitis. Celiac: Slight hook like configuration of the celiac origin may reflect some compression by the diaphragmatic crura. Vessel is otherwise patent. No evidence of active contrast extravasation. No acute luminal abnormality. No features of vasculitis. SMA: Patent without evidence of aneurysm, dissection, vasculitis or significant stenosis. Renals: Single renal arteries bilaterally. No aneurysm, dissection, vasculitis or features of fibromuscular dysplasia. IMA: Mild ostial narrowing secondary to a noncalcified atheromatous plaque. More distal vessel is patent without aneurysm, dissection or features of vasculitis. Inflow: Atherosclerotic plaque of the inflow vessels without aneurysm or acute luminal irregularity. Veins: Hyperdense material surrounds portions of the SMV but does not appear to be the primary source of hyperdense material in the abdomen. Review of the MIP images confirms the above findings. NON-VASCULAR Hepatobiliary: No focal liver abnormality is seen. No gallstones, gallbladder wall thickening, or biliary dilatation. Pancreas: Unremarkable. Small amount of high attenuation material adjacent the pancreatic head. High attenuation  fluid is not appear focally centered upon the pancreas however. Spleen: Normal in size without focal abnormality. Adrenals/Urinary Tract: 2.7 cm fluid attenuation cyst in the upper pole left kidney. No concerning renal lesions. Symmetric renal enhancement. No urolithiasis or hydronephrosis. Stomach/Bowel: Transesophageal tube tip and side port within the gastric lumen. There is indistinctness of the first and second portions of the duodenal sweep with a large amount of fluid which appears largely centered upon the first  and second portions of the duodenum and much of which appears confined to the upper abdomen and lesser sac as well as tracking along the duodenal sweep. Distal small bowel is fluid-filled but not frankly distended. No colonic dilatation or wall thickening. Scattered colonic diverticula without focal pericolonic inflammation to suggest diverticulitis. A normal appendix is visualized. Lymphatic: Reactive lymph nodes are present in the retroperitoneum. No pathologically enlarged nodes in the abdomen or pelvis. Reproductive: The prostate and seminal vesicles are unremarkable. Other: Large volume of intermediate to high attenuation material surrounding the duodenum in the upper abdomen measuring between 50-80 Hounsfield units. Additional fluid is seen in the perihepatic space as well as tracking along the pericolic gutters with trace fluid layering in the deep pelvis Musculoskeletal: Multilevel degenerative changes are present in the imaged portions of the spine. No acute osseous abnormality or suspicious osseous lesion. Review of the MIP images confirms the above findings. IMPRESSION: 1. No acute aortic abnormality. 2. Large volume of intermediate to high attenuation material mostly surrounding the first and second portions of the duodenum. No sites of active contrast extravasation or free air is seen however. Findings could reflect a duodenal perforation or injury either primary or secondary to  resuscitative efforts versus a perforated duodenal ulcer. Potential options for further assessment could include a repeat, noncontrast CT of the abdomen to assess for accumulation of the intravenous contrast administered on this exam. 3. Bilateral rib and costal cartilage fractures as detailed above. No pneumothorax. Patchy airspace disease in the anterior lungs may reflect associated pulmonary contusion. 4. Dependent airspace opacity likely reflecting further atelectatic change and volume loss in the setting of resuscitation. 5. Cardiomegaly and coronary artery atherosclerosis. 6. Colonic diverticulosis without evidence of diverticulitis. 7. Endotracheal tube terminates 3.6 cm from the carina. 8. Transesophageal tube tip and side port distal to the GE junction. 9. Aortic Atherosclerosis (ICD10-I70.0). 10. Minimal atheromatous narrowing of the IMA origin. Critical Value/emergent results were called by telephone at the time of interpretation on 02/12/2019 at 2:01 am to [place] , who verbally acknowledged these results. Electronically Signed   By: Lovena Le M.D.   On: 02/12/2019 02:01    Microbiology Recent Results (from the past 240 hour(s))  SARS Coronavirus 2 by RT PCR (hospital order, performed in [facility] hospital lab) Nasopharyngeal Nasopharyngeal Swab     Status: None   Collection Time: 02/12/19  2:40 AM   Specimen: Nasopharyngeal Swab  Result Value Ref Range Status   SARS Coronavirus 2 NEGATIVE NEGATIVE Final    Comment: (NOTE) SARS-CoV-2 target nucleic acids are NOT DETECTED. The SARS-CoV-2 RNA is generally detectable in upper and lower respiratory specimens during the acute phase of infection. The lowest concentration of SARS-CoV-2 viral copies this assay can detect is 250 copies / mL. A negative result does not preclude SARS-CoV-2 infection and should not be used as the sole basis for treatment or other patient management decisions.  A negative result may occur  with improper specimen collection / handling, submission of specimen other than nasopharyngeal swab, presence of viral mutation(s) within the areas targeted by this assay, and inadequate number of viral copies (<250 copies / mL). A negative result must be combined with clinical observations, patient history, and epidemiological information. Fact Sheet for Patients:   StrictlyIdeas.no Fact Sheet for Healthcare Providers: BankingDealers.co.za This test is not yet approved or cleared  by the [place] FDA and has been authorized for detection and/or diagnosis of SARS-CoV-2 by FDA under an Emergency Use Authorization (  EUA).  This EUA will remain in effect (meaning this test can be used) for the duration of the COVID-19 declaration under Section 564(b)(1) of the Act, 21 U.S.C. section 360bbb-3(b)(1), unless the authorization is terminated or revoked sooner. Performed at [facility], [place] [address] [place], [place] [address]   Culture, blood (routine x 2)     Status: None   Collection Time: 02/12/19  4:00 AM   Specimen: BLOOD RIGHT HAND  Result Value Ref Range Status   Specimen Description BLOOD RIGHT HAND  Final   Special Requests   Final    AEROBIC BOTTLE ONLY Blood Culture results may not be optimal due to an inadequate volume of blood received in culture bottles   Culture   Final    NO GROWTH 5 DAYS Performed at [facility], [place] [address], [place], [place] [address]    Report Status 02/17/2019 FINAL  Final  MRSA PCR Screening     Status: None   Collection Time: 02/12/19  4:20 AM   Specimen: Nasopharyngeal  Result Value Ref Range Status   MRSA by PCR NEGATIVE NEGATIVE Final    Comment:        The GeneXpert MRSA Assay (FDA approved for NASAL specimens only), is one component of a comprehensive MRSA colonization surveillance program. It is not intended to diagnose MRSA infection nor to guide or monitor  treatment for MRSA infections. Performed at [facility], [place] [address] [place], [place] [address]   Culture, blood (routine x 2)     Status: None   Collection Time: 02/12/19  4:47 AM   Specimen: Site Not Specified; Blood  Result Value Ref Range Status   Specimen Description SITE NOT SPECIFIED  Final   Special Requests   Final    BOTTLES DRAWN AEROBIC AND ANAEROBIC Blood Culture adequate volume   Culture   Final    NO GROWTH 5 DAYS Performed at [facility], [place] [address], [place], [place] [address]    Report Status 02/17/2019 FINAL  Final  Culture, respiratory     Status: None (Preliminary result)   Collection Time: 02/14/19  4:29 PM   Specimen: SPU  Result Value Ref Range Status   Specimen Description SPUTUM  Final   Special Requests NONE  Final   Gram Stain   Final    NO WBC SEEN RARE GRAM POSITIVE COCCI IN PAIRS NO SQUAMOUS EPITHELIAL CELLS PRESENT    Culture   Final    RARE YEAST IDENTIFICATION TO FOLLOW Performed at [facility], [place] [address], [place], [place] [address]    Report Status PENDING  Incomplete  Culture, blood (routine x 2)     Status: None (Preliminary result)   Collection Time: 02/14/19  4:50 PM   Specimen: BLOOD  Result Value Ref Range Status   Specimen Description BLOOD RIGHT ANTECUBITAL  Final   Special Requests   Final    BOTTLES DRAWN AEROBIC AND ANAEROBIC Blood Culture adequate volume   Culture   Final    NO GROWTH 3 DAYS Performed at [facility], [place] [address] [place], [place] [address]    Report Status PENDING  Incomplete  Culture, blood (routine x 2)     Status: None (Preliminary result)   Collection Time: 02/14/19  4:57 PM   Specimen: BLOOD RIGHT ARM  Result Value Ref Range Status   Specimen Description BLOOD RIGHT ARM  Final   Special Requests  Final    BOTTLES DRAWN AEROBIC AND ANAEROBIC Blood Culture adequate volume   Culture   Final    NO GROWTH 3 DAYS Performed at [facility], [place]  [address], [place], [place] [address]    Report Status PENDING  Incomplete  Urine Culture     Status: None   Collection Time: 02/14/19 11:46 PM   Specimen: Urine, Random  Result Value Ref Range Status   Specimen Description URINE, RANDOM  Final   Special Requests NONE  Final   Culture   Final    NO GROWTH Performed at [facility], [place] [address] [place], [place] [address]    Report Status 02/16/2019 FINAL  Final  SARS Coronavirus 2 by RT PCR (hospital order, performed in [facility] hospital lab) Nasopharyngeal Nasopharyngeal Swab     Status: None   Collection Time: 03-13-19  9:09 AM   Specimen: Nasopharyngeal Swab  Result Value Ref Range Status   SARS Coronavirus 2 NEGATIVE NEGATIVE Final    Comment: (NOTE) SARS-CoV-2 target nucleic acids are NOT DETECTED. The SARS-CoV-2 RNA is generally detectable in upper and lower respiratory specimens during the acute phase of infection. The lowest concentration of SARS-CoV-2 viral copies this assay can detect is 250 copies / mL. A negative result does not preclude SARS-CoV-2 infection and should not be used as the sole basis for treatment or other patient management decisions.  A negative result may occur with improper specimen collection / handling, submission of specimen other than nasopharyngeal swab, presence of viral mutation(s) within the areas targeted by this assay, and inadequate number of viral copies (<250 copies / mL). A negative result must be combined with clinical observations, patient history, and epidemiological information. Fact Sheet for Patients:   StrictlyIdeas.no Fact Sheet for Healthcare Providers: BankingDealers.co.za This test is not yet approved or cleared  by the [place] FDA and has been authorized for detection and/or diagnosis of SARS-CoV-2 by FDA under an Emergency Use Authorization (EUA).  This EUA will remain in effect (meaning this test can be used) for  the duration of the COVID-19 declaration under Section 564(b)(1) of the Act, 21 U.S.C. section 360bbb-3(b)(1), unless the authorization is terminated or revoked sooner. Performed at [facility], [place] [address], [place], [place] [address]     Lab Basic Metabolic Panel: Recent Labs  Lab 01/19/2019 2342  02/12/19 0447  02/13/19 1116  02/14/19 0327 02/14/19 0347 02/14/19 1652 Mar 13, 2019 0312 03/13/2019 0324  NA 137   < > 139   < > 141   < > 140 142 142 141 141  K 4.0   < > 4.2   < > 2.7*   < > 3.1* 3.1* 3.2* 3.5 3.5  CL 103   < > 108  --  102  --   --  106 108 109  --   CO2 15*  --  21*  --  26  --   --  26 24 23   --   GLUCOSE 220*   < > 124*  --  93  --   --  98 95 102*  --   BUN 16   < > 14  --  11  --   --  9 8 7   --   CREATININE 1.56*   < > 1.10  --  1.22  --   --  1.06 1.02 0.96  --   CALCIUM 8.8*  --  7.4*  --  7.7*  --   --  8.0* 8.1* 8.0*  --   MG 2.5*  --  1.9  --   --   --   --  2.2 2.0  --   --   PHOS  --   --  3.2  --   --   --   --  3.3 3.5  --   --    < > = values in this interval not displayed.   Liver Function Tests: Recent Labs  Lab 02/04/2019 2342 02/14/19 1652 2019-02-25 0312  AST 42* 41 40  ALT 34 23 20  ALKPHOS 66 53 46  BILITOT 0.4 0.7 0.6  PROT 6.8 5.2* 4.9*  ALBUMIN 4.1 2.6* 2.3*   No results for input(s): LIPASE, AMYLASE in the last 168 hours. No results for input(s): AMMONIA in the last 168 hours. CBC: Recent Labs  Lab 02/12/19 1250 02/12/19 2030 02/13/19 0450 02/13/19 1125 02/14/19 0327 02/14/19 0347 02/14/19 1652 02/25/19 0324  WBC 8.8 7.5 7.5  --   --  7.0 7.2  --   NEUTROABS  --   --   --   --   --  4.5  --   --   HGB 10.7* 10.4* 9.5* 9.2* 15.0 9.6* 9.7* 8.2*  HCT 31.3* 30.7* 28.5* 27.0* 44.0 28.5* 29.3* 24.0*  MCV 91.0 91.6 92.5  --   --  92.2 93.0  --   PLT 187 161 139*  --   --  138* 132*  --    Cardiac Enzymes: No results for input(s): CKTOTAL, CKMB, CKMBINDEX, TROPONINI in the last 168 hours. Sepsis Labs: Recent Labs   Lab 02/08/2019 2342 02/12/19 0239 02/12/19 0409  02/12/19 2030 02/13/19 0450 02/13/19 1116 02/14/19 0347 02/14/19 1652  WBC 8.4  --   --    < > 7.5 7.5  --  7.0 7.2  LATICACIDVEN 6.7* 3.4* 2.9*  --   --   --  1.4  --   --    < > = values in this interval not displayed.    Procedures/Operations  ETT [place] 02/18/2019, 7:17 AM

## 2020-10-02 IMAGING — CT CT HEAD W/O CM
2 series · 15 of 30 positions shown, 17 images · non-contrast
Comparison: 02/12/2019

CLINICAL DATA: Abnormal EEG, history of cardiac arrest

EXAM:
CT HEAD WITHOUT CONTRAST
TECHNIQUE: Contiguous axial images were obtained from the base of the skull
through the vertex without intravenous contrast.

[Series 3: head wo · axial · 0.46mm/px · z∈[-100,+24]mm · 7 of 35 slices shown, 9 images]
[im 5/35  brain]
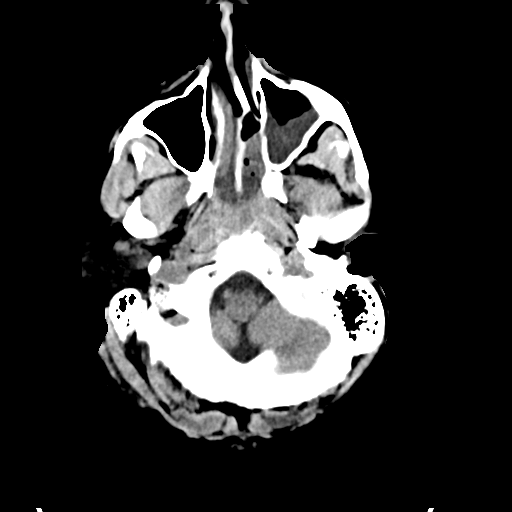
[im 5/35  bone]
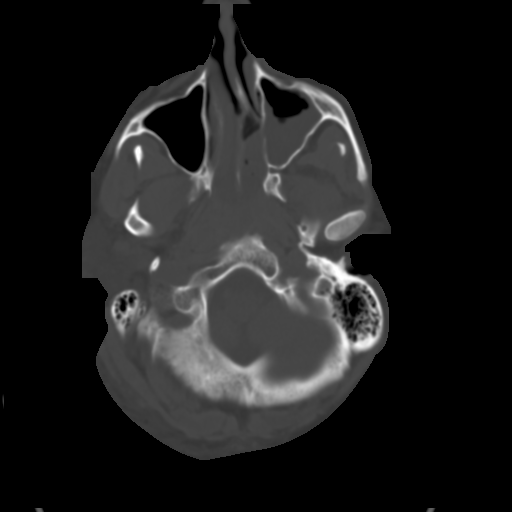
[im 9/35  brain]
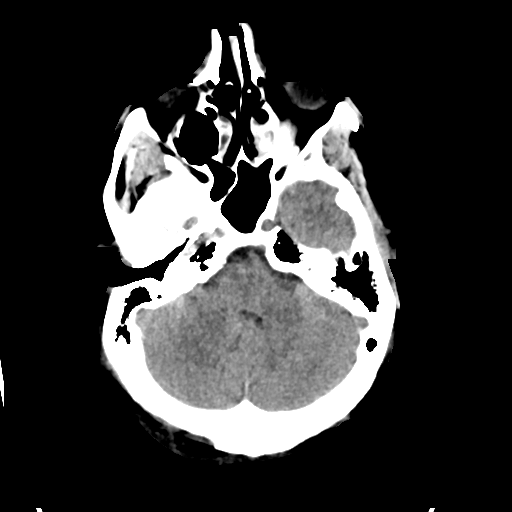
[im 13/35  brain]
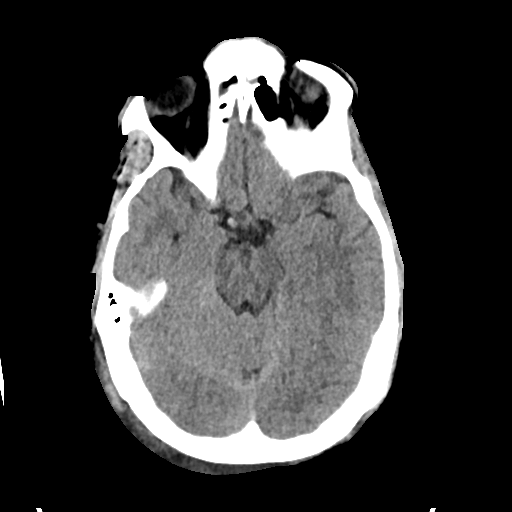
[im 18/35  brain]
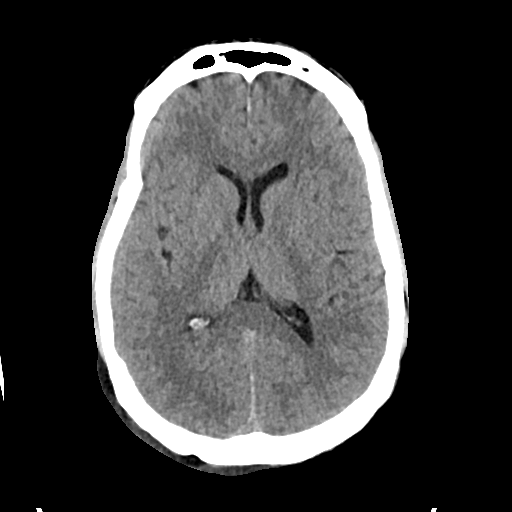
[im 22/35  brain]
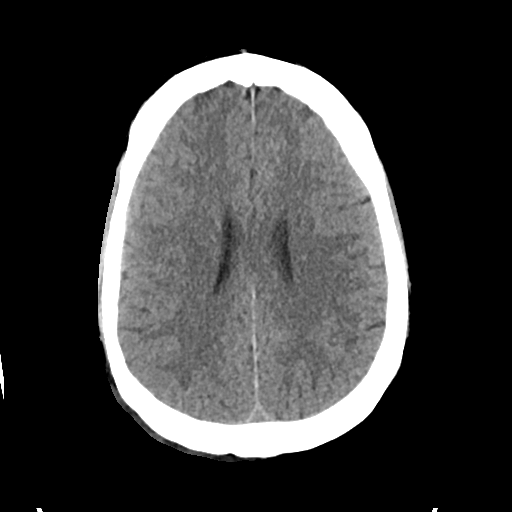
[im 22/35  bone]
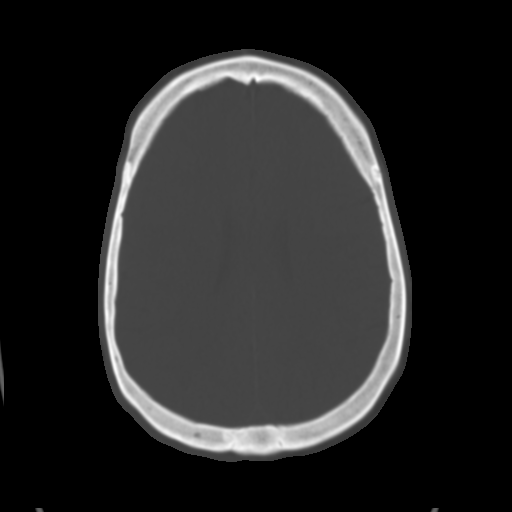
[im 26/35  brain]
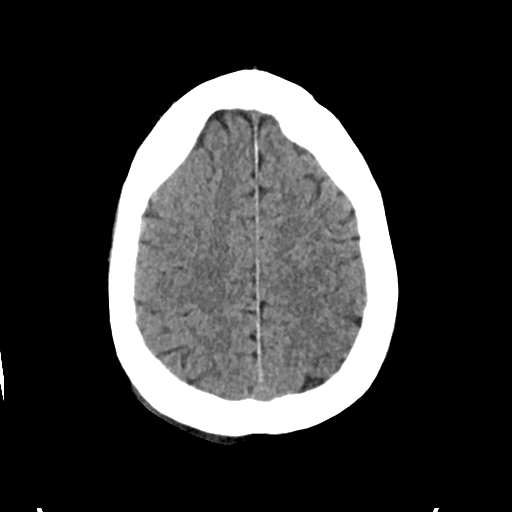
[im 30/35  brain]
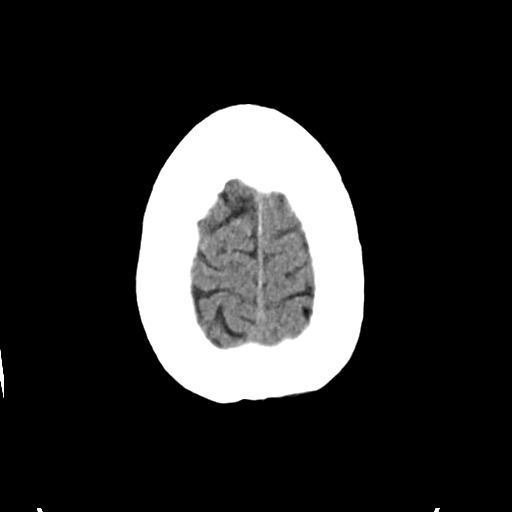

[Series 4: head bone · axial · 0.46mm/px · z∈[-104,+36]mm · 8 of 88 slices shown]
[im 9/88  bone]
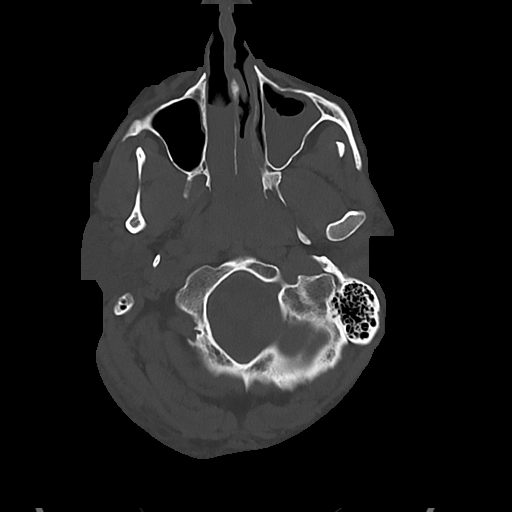
[im 18/88  bone]
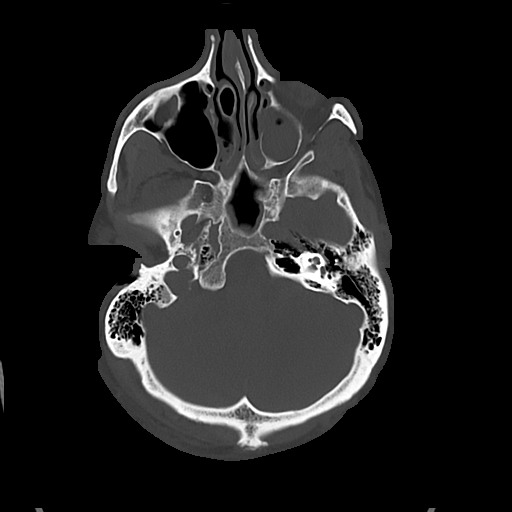
[im 27/88  bone]
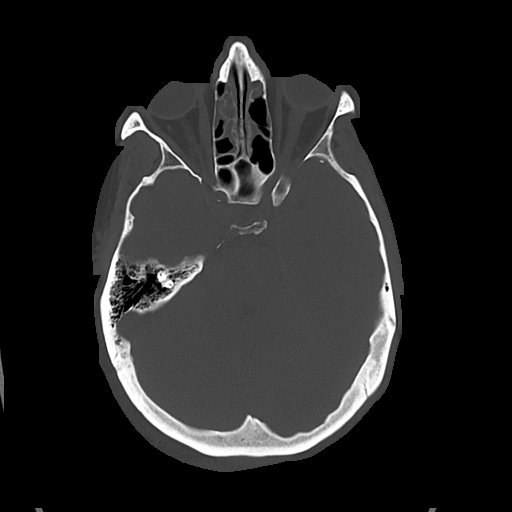
[im 40/88  bone]
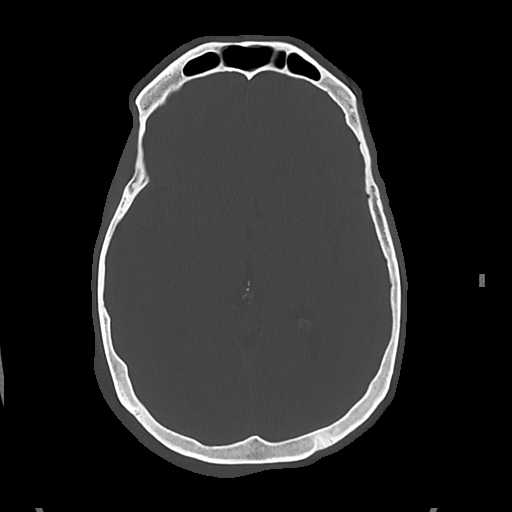
[im 48/88  bone]
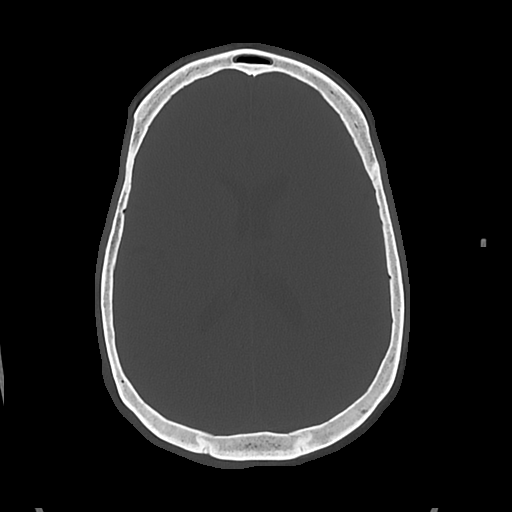
[im 61/88  bone]
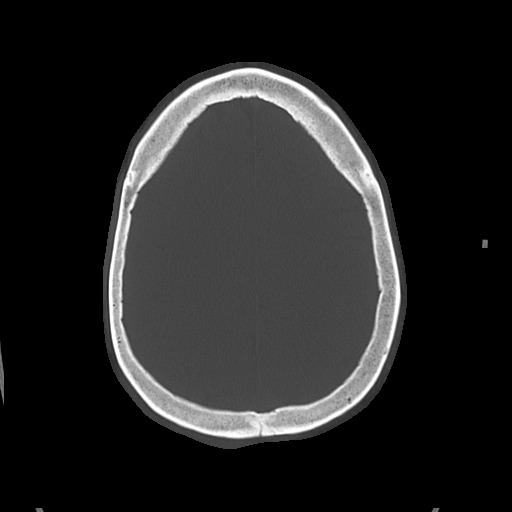
[im 70/88  bone]
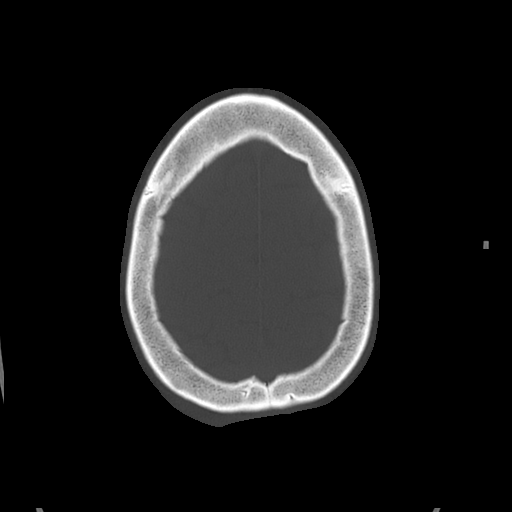
[im 79/88  bone]
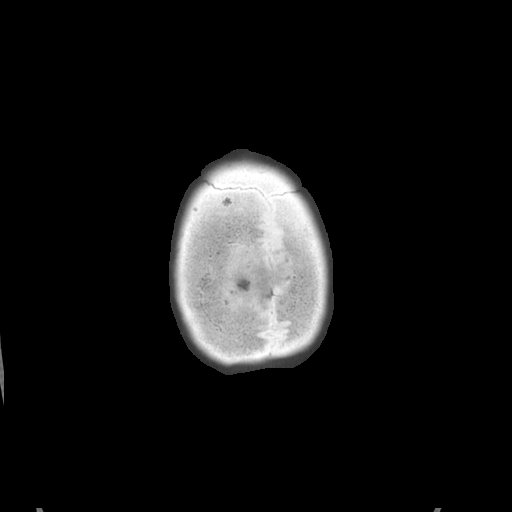

[15 of 30 positions shown; findings below may reference images not displayed]

FINDINGS: Brain: There is no acute intracranial hemorrhage, mass-effect, or
edema. Gray-white differentiation is preserved. There is no
extra-axial fluid collection. Ventricles and sulci are within normal
limits in size and configuration.

Vascular: No acute abnormality.

Skull: No acute abnormality.

Sinuses/Orbits: Polypoid partial opacification of the paranasal
sinuses with greatest involvement of the ethmoids and left maxillary
sinus.

Other: Nonspecific opacification of the nasal cavity and visualized
pharynx, likely retained secretions. Right posterior scalp soft
tissue swelling.
IMPRESSION: No acute intracranial hemorrhage or evidence of evolving recent
infarction. There is no evidence of hypoxic/ischemic injury at this
time on CT; consider MRI when possible.

## 2020-10-03 IMAGING — DX DG CHEST 1V PORT
1 series · 1 of 1 positions shown · non-contrast
Comparison: Chest radiograph 02/11/2019 and CTA 02/12/2019

CLINICAL DATA: Respiratory failure. Status post CPR.

EXAM:
PORTABLE CHEST 1 VIEW

[chest]
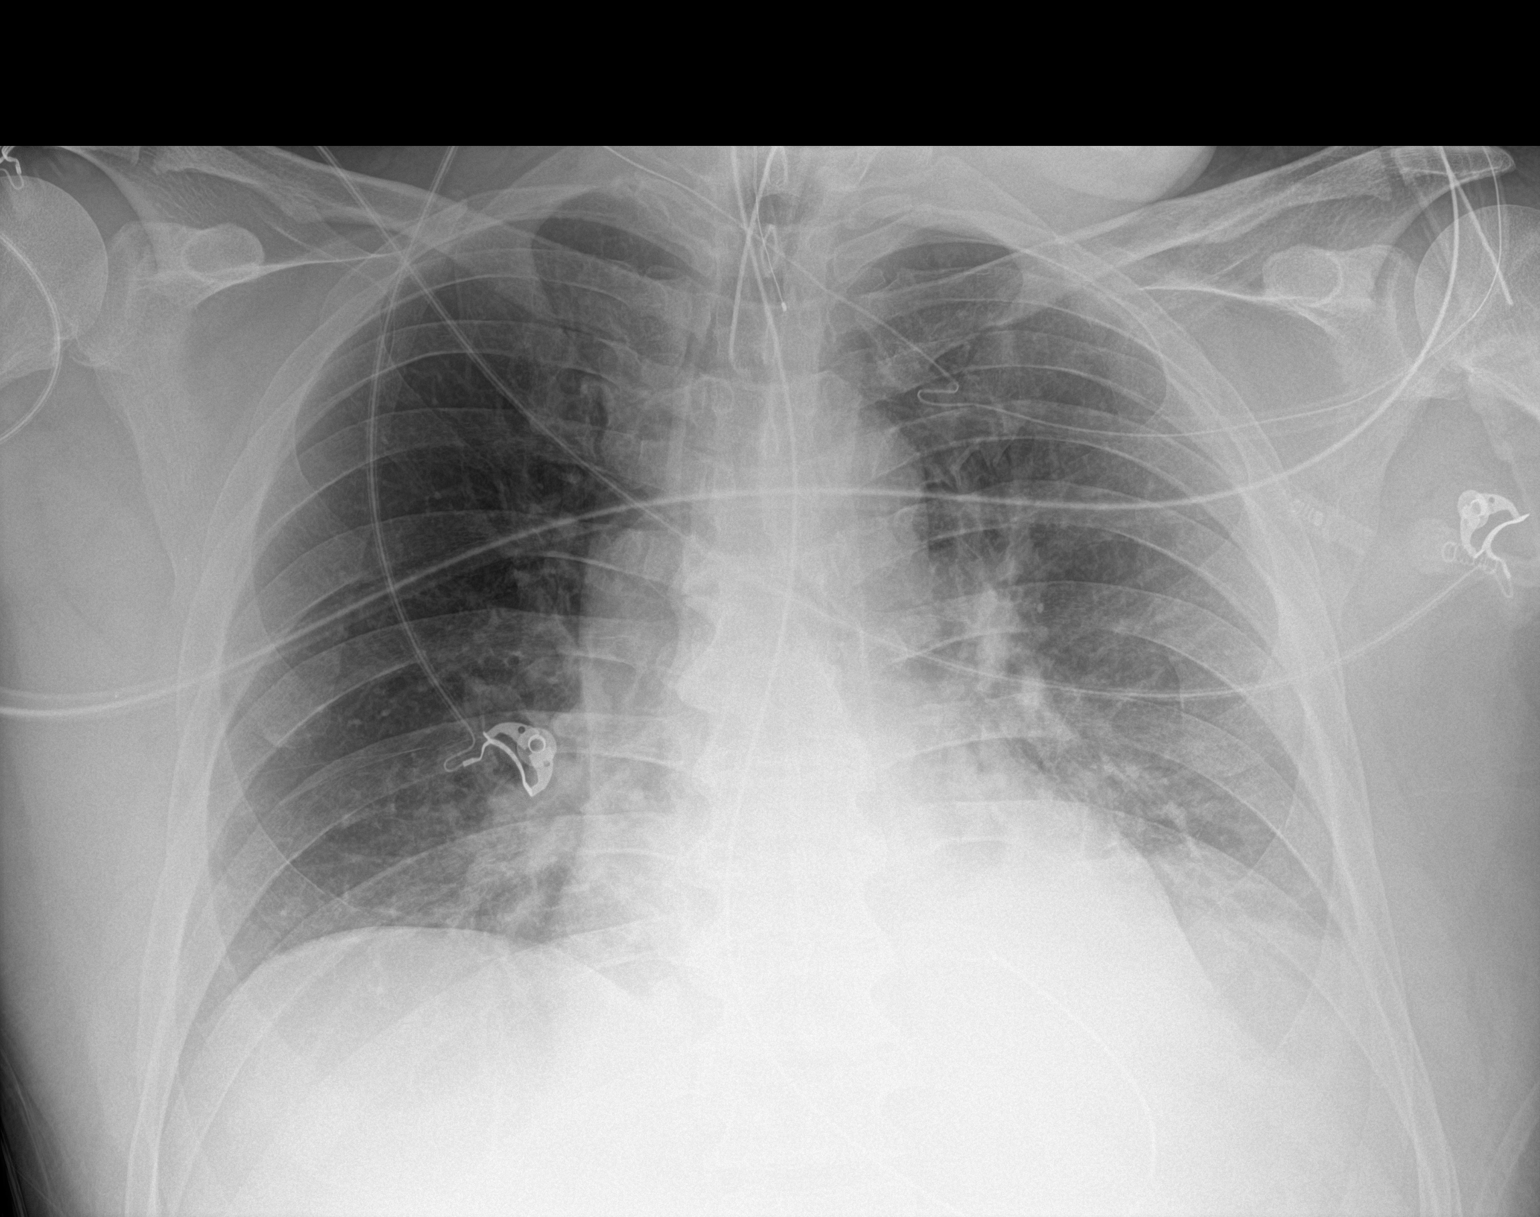

[1 of 1 positions shown; findings below may reference images not displayed]

FINDINGS: Endotracheal tube terminates at the level of the clavicles. Enteric
tube terminates over the proximal stomach. The cardiomediastinal
silhouette is unchanged. Lung volumes are low with mild central
pulmonary vascular congestion without overt edema. There is
increased opacity in the left lung base likely reflecting
atelectasis and a small pleural effusion. There is also mild right
basilar opacity. No pneumothorax is identified.
IMPRESSION: Low lung volumes with increasing left greater than right basilar
opacities likely reflecting atelectasis and a small left pleural
effusion.

## 2020-10-04 IMAGING — CR DG ABDOMEN 1V
2 series · 2 of 2 positions shown · non-contrast
Comparison: None.

CLINICAL DATA: Organ procurement.

EXAM:
ABDOMEN - 1 VIEW

[AP (1 of 2)]
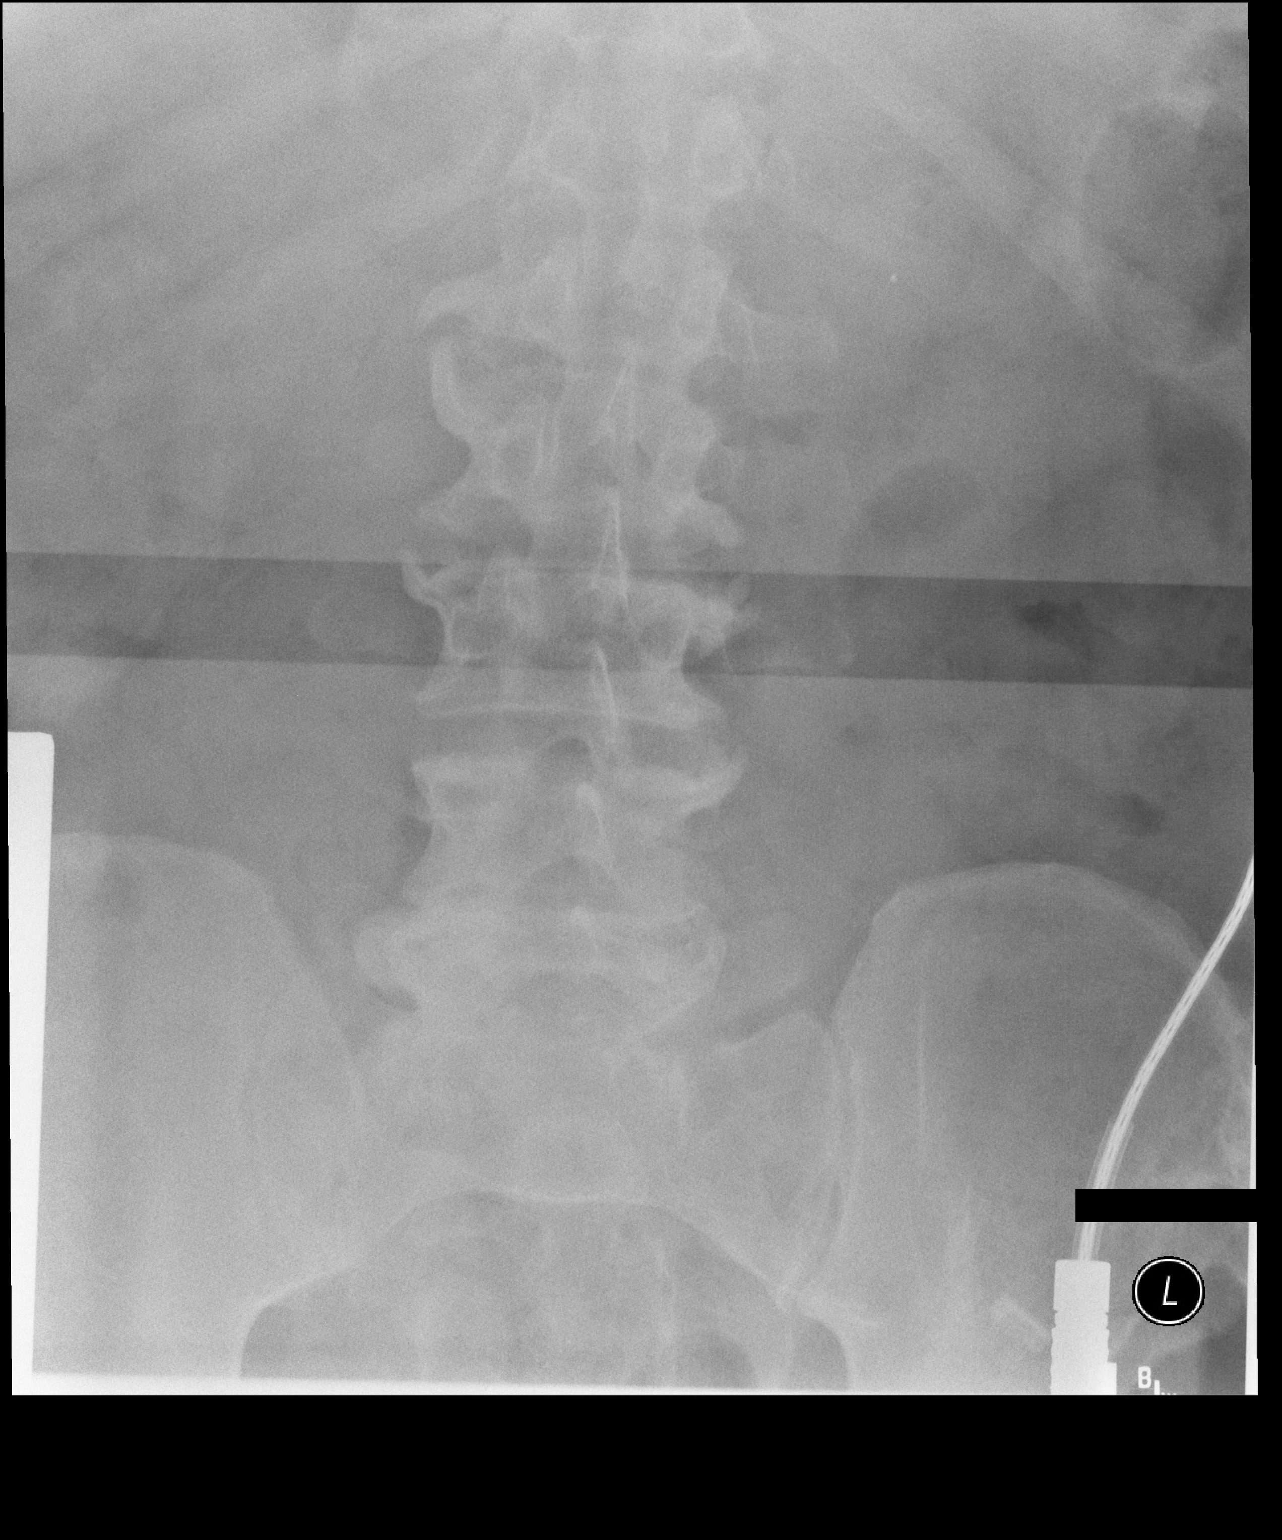

[AP (2 of 2)]
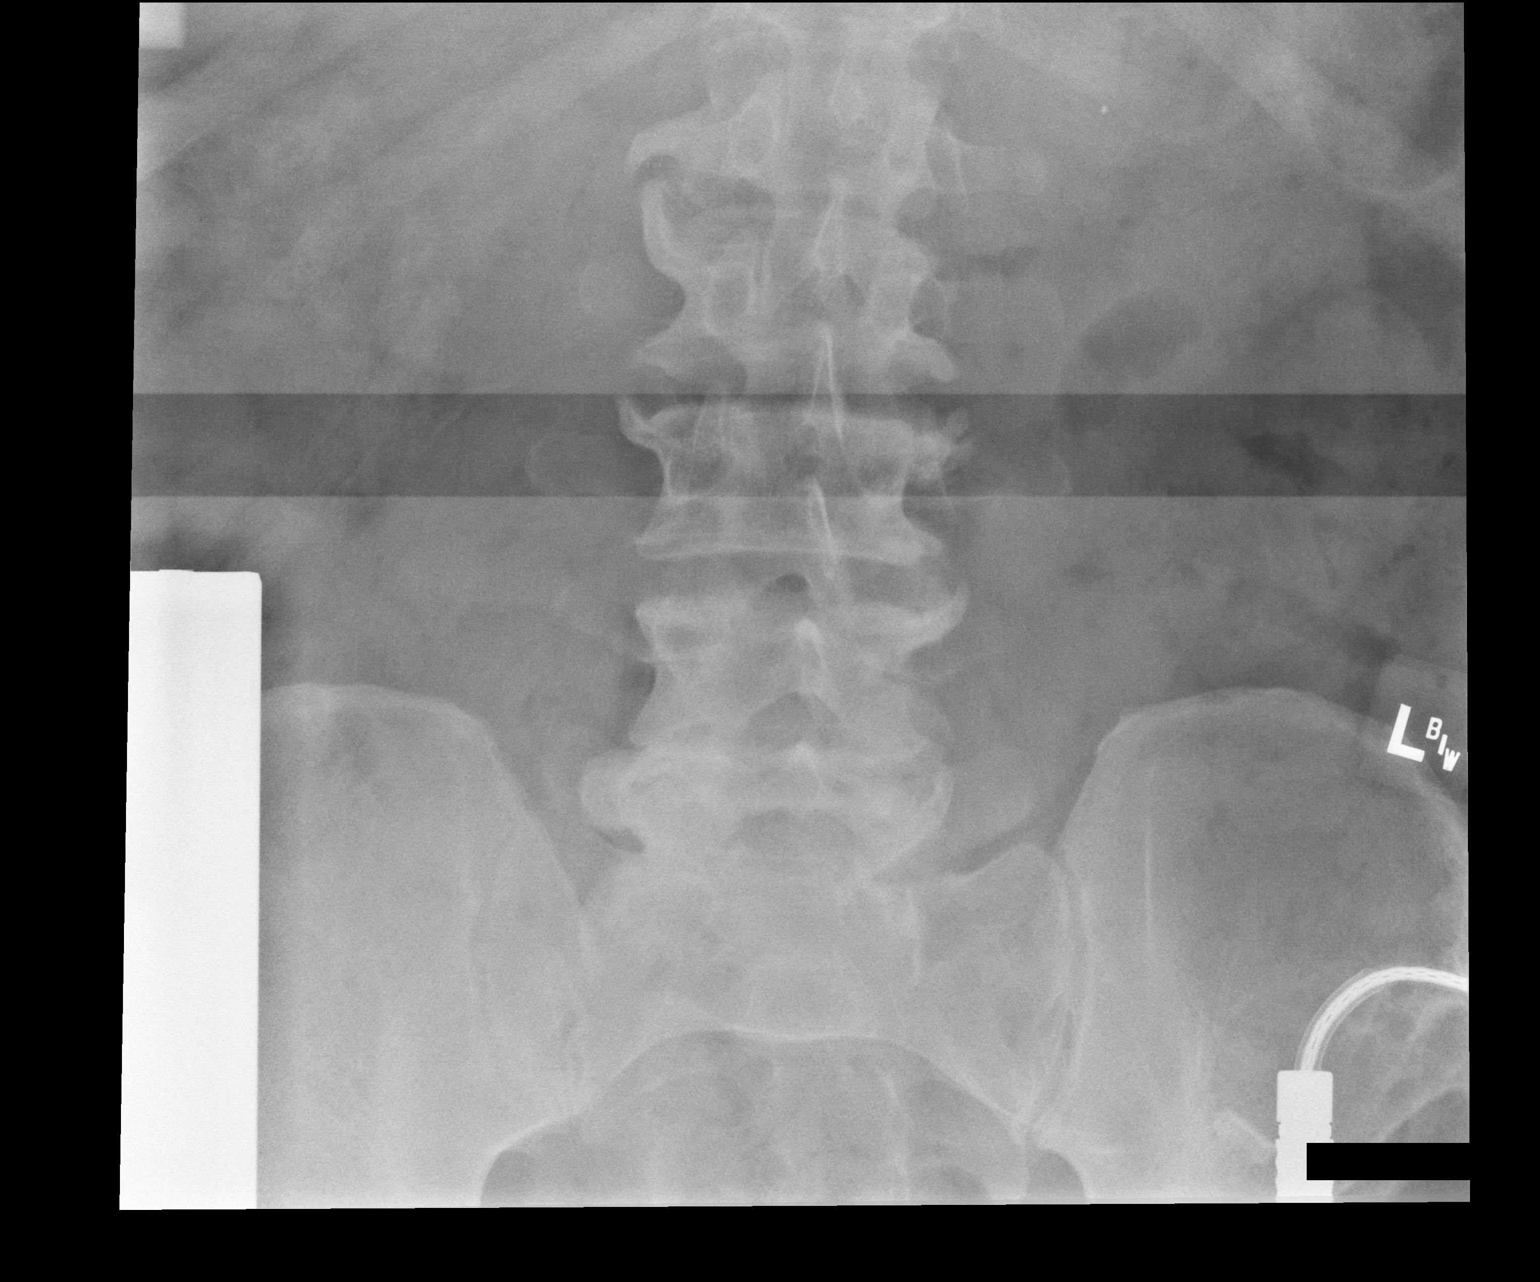

[2 of 2 positions shown; findings below may reference images not displayed]

FINDINGS: A punctate focus of high attenuation is projected over the left
upper quadrant of the abdomen, probably a soft tissue calcification
such as a renal stone. No surgical instruments are seen.
IMPRESSION: No retained surgical instruments.
